# Patient Record
Sex: Male | Born: 1960 | Race: White | Hispanic: No | Smoking: Current every day smoker
Health system: Southern US, Community
[De-identification: ages and names within clinical notes are randomized; demographics above are authoritative.]

## PROBLEM LIST (undated history)

## (undated) DIAGNOSIS — G4733 Obstructive sleep apnea (adult) (pediatric): Secondary | ICD-10-CM

## (undated) DIAGNOSIS — I452 Bifascicular block: Secondary | ICD-10-CM

## (undated) DIAGNOSIS — I4892 Unspecified atrial flutter: Secondary | ICD-10-CM

## (undated) HISTORY — DX: Bifascicular block: I45.2

## (undated) HISTORY — PX: ATRIAL ABLATION SURGERY: SHX560

## (undated) HISTORY — DX: Unspecified atrial flutter: I48.92

## (undated) HISTORY — PX: NASAL SINUS SURGERY: SHX719

## (undated) HISTORY — DX: Obstructive sleep apnea (adult) (pediatric): G47.33

## (undated) HISTORY — PX: TONSILLECTOMY: SHX5217

---

## 2003-09-25 ENCOUNTER — Ambulatory Visit (HOSPITAL_COMMUNITY): Admission: RE | Admit: 2003-09-25 | Discharge: 2003-09-25 | Payer: Self-pay | Admitting: Pulmonary Disease

## 2005-02-05 ENCOUNTER — Ambulatory Visit: Admission: RE | Admit: 2005-02-05 | Discharge: 2005-02-05 | Payer: Self-pay | Admitting: *Deleted

## 2005-02-14 ENCOUNTER — Ambulatory Visit: Payer: Self-pay | Admitting: Pulmonary Disease

## 2005-09-05 ENCOUNTER — Ambulatory Visit (HOSPITAL_COMMUNITY): Admission: RE | Admit: 2005-09-05 | Discharge: 2005-09-05 | Payer: Self-pay | Admitting: Pulmonary Disease

## 2009-07-11 ENCOUNTER — Encounter: Payer: Self-pay | Admitting: Cardiology

## 2009-07-12 ENCOUNTER — Ambulatory Visit: Payer: Self-pay | Admitting: Cardiology

## 2009-07-12 ENCOUNTER — Encounter: Payer: Self-pay | Admitting: Cardiology

## 2009-07-13 ENCOUNTER — Encounter: Payer: Self-pay | Admitting: Cardiology

## 2009-07-13 ENCOUNTER — Inpatient Hospital Stay (HOSPITAL_COMMUNITY): Admission: EM | Admit: 2009-07-13 | Discharge: 2009-07-17 | Payer: Self-pay | Admitting: Cardiology

## 2009-07-13 ENCOUNTER — Ambulatory Visit: Payer: Self-pay | Admitting: Cardiology

## 2009-07-14 ENCOUNTER — Encounter: Payer: Self-pay | Admitting: Internal Medicine

## 2009-07-15 ENCOUNTER — Encounter: Payer: Self-pay | Admitting: Cardiology

## 2009-07-15 ENCOUNTER — Encounter: Payer: Self-pay | Admitting: Cardiovascular Disease

## 2009-07-16 ENCOUNTER — Encounter: Payer: Self-pay | Admitting: Cardiology

## 2009-07-20 ENCOUNTER — Ambulatory Visit: Payer: Self-pay | Admitting: Cardiology

## 2009-07-20 LAB — CONVERTED CEMR LAB: POC INR: 1.9

## 2009-07-23 ENCOUNTER — Ambulatory Visit: Payer: Self-pay | Admitting: Cardiology

## 2009-07-30 ENCOUNTER — Ambulatory Visit: Payer: Self-pay | Admitting: Cardiology

## 2009-07-30 LAB — CONVERTED CEMR LAB: POC INR: 2.1

## 2009-08-06 ENCOUNTER — Ambulatory Visit: Payer: Self-pay | Admitting: Cardiology

## 2009-08-11 DIAGNOSIS — Z9119 Patient's noncompliance with other medical treatment and regimen: Secondary | ICD-10-CM

## 2009-08-11 DIAGNOSIS — Z91199 Patient's noncompliance with other medical treatment and regimen due to unspecified reason: Secondary | ICD-10-CM | POA: Insufficient documentation

## 2009-08-11 DIAGNOSIS — G4733 Obstructive sleep apnea (adult) (pediatric): Secondary | ICD-10-CM

## 2009-08-11 DIAGNOSIS — I4891 Unspecified atrial fibrillation: Secondary | ICD-10-CM

## 2009-08-13 ENCOUNTER — Ambulatory Visit: Payer: Self-pay | Admitting: Cardiology

## 2009-08-13 DIAGNOSIS — I4892 Unspecified atrial flutter: Secondary | ICD-10-CM

## 2009-08-13 DIAGNOSIS — J45909 Unspecified asthma, uncomplicated: Secondary | ICD-10-CM | POA: Insufficient documentation

## 2009-09-02 ENCOUNTER — Encounter: Payer: Self-pay | Admitting: Cardiology

## 2010-12-22 LAB — CBC
HCT: 44.1 % (ref 39.0–52.0)
Hemoglobin: 15 g/dL (ref 13.0–17.0)
Hemoglobin: 15.3 g/dL (ref 13.0–17.0)
MCHC: 33.9 g/dL (ref 30.0–36.0)
MCHC: 34.1 g/dL (ref 30.0–36.0)
Platelets: 124 10*3/uL — ABNORMAL LOW (ref 150–400)
RBC: 4.68 MIL/uL (ref 4.22–5.81)
RBC: 4.85 MIL/uL (ref 4.22–5.81)
RDW: 13.7 % (ref 11.5–15.5)
RDW: 13.9 % (ref 11.5–15.5)
WBC: 6.3 10*3/uL (ref 4.0–10.5)

## 2010-12-22 LAB — BASIC METABOLIC PANEL
BUN: 7 mg/dL (ref 6–23)
CO2: 25 mEq/L (ref 19–32)
Calcium: 8.5 mg/dL (ref 8.4–10.5)
Glucose, Bld: 115 mg/dL — ABNORMAL HIGH (ref 70–99)
Sodium: 135 mEq/L (ref 135–145)

## 2010-12-22 LAB — PROTIME-INR
INR: 1.18 (ref 0.00–1.49)
INR: 1.81 — ABNORMAL HIGH (ref 0.00–1.49)
Prothrombin Time: 14.9 seconds (ref 11.6–15.2)
Prothrombin Time: 20.8 seconds — ABNORMAL HIGH (ref 11.6–15.2)
Prothrombin Time: 23.6 seconds — ABNORMAL HIGH (ref 11.6–15.2)

## 2010-12-22 LAB — HEPARIN LEVEL (UNFRACTIONATED)
Heparin Unfractionated: 0.62 IU/mL (ref 0.30–0.70)
Heparin Unfractionated: 0.66 IU/mL (ref 0.30–0.70)

## 2010-12-22 LAB — LIPID PANEL
Cholesterol: 168 mg/dL (ref 0–200)
Total CHOL/HDL Ratio: 5.4 RATIO
VLDL: 24 mg/dL (ref 0–40)

## 2011-02-03 NOTE — Procedures (Signed)
NAME:  ANDERS, HOHMANN               ACCOUNT NO.:  [id number]   MEDICAL RECORD NO.:  [id number]          PATIENT TYPE:  OUT   LOCATION:  RAD                           FACILITY:  [facility]   PHYSICIAN:  Edward L. Juanetta Gosling, M.D.DATE OF BIRTH:  Aug 14, 1961   DATE OF PROCEDURE:  09/05/2005  DATE OF DISCHARGE:                              PULMONARY FUNCTION TEST   RESULTS:  1.  Spirometry shows a very mild ventilatory defect with some evidence of      airflow obstruction in the level of the smaller airways.  2.  Lung volumes are normal.  3.  Diffusion capacity of carbon monoxide (DLCO) is normal.  4.  Arterial blood gases show mild relative resting hypoxemia.  5.  There is significant bronchodilator improvement.      Edward L. Juanetta Gosling, M.D.  Electronically Signed     ELH/MEDQ  D:  09/07/2005  T:  09/08/2005  Job:  [id number]

## 2011-02-03 NOTE — Procedures (Signed)
NAME:  Elijah Beard, Elijah Beard                         ACCOUNT NO.:  [id number]   MEDICAL RECORD NO.:  [id number]                   PATIENT TYPE:  OUT   LOCATION:  RESP                                 FACILITY:  [facility]   PHYSICIAN:  Edward L. Juanetta Gosling, M.D.             DATE OF BIRTH:  1961/07/31   DATE OF PROCEDURE:  DATE OF DISCHARGE:  09/25/2003                              PULMONARY FUNCTION TEST   Spirometry is normal.      ___________________________________________                                            Oneal Deputy. Juanetta Gosling, M.D.   ELH/MEDQ  D:  09/28/2003  T:  09/29/2003  Job:  [id number]

## 2011-02-03 NOTE — Procedures (Signed)
NAME:  Elijah Beard, Elijah Beard NO.:  [id number]   MEDICAL RECORD NO.:  [id number]          PATIENT TYPE:  OUT   LOCATION:  SLEEP LAB                     FACILITY:  [facility]   PHYSICIAN:  Marcelyn Bruins, M.D. [facility] DATE OF BIRTH:  23-May-1961   DATE OF STUDY:  02/05/2005                              NOCTURNAL POLYSOMNOGRAM   REFERRING PHYSICIAN:  Dr. Kem Boroughs.   INDICATION FOR THE STUDY:  Hypersomnia with sleep apnea. Epworth score: 13.   SLEEP ARCHITECTURE:  The patient had total sleep time of 305 minutes with  significantly reduced REM and slow wave sleep. Sleep onset was very  prolonged at 100 minutes and REM was fairly normal at 77 minutes.   IMPRESSION:  1. Mild obstructive sleep apnea/hypopnea syndrome with a respiratory      disturbance index of 14 events per hour and oxygen desaturation as low      as 88%. Events were not positional. The patient did not meet split      night criteria secondary to the small numbers of events, and most of      these occurring in the latter half of the study. Treatment for this      degree of sleep apnea may include weight loss, upper airway surgery,      oral appliance, and possibly C-PAP.  2. Snoring noted throughout the study but was not quantified by the sleep      technician.  3. Moderate numbers of leg jerks with significant sleep disruption.      Clinical correlation is suggested after appropriate treatment of sleep      apnea.  4. No clinically significant cardiac arrhythmias.      KC/MEDQ  D:  02/14/2005 12:59:13  T:  02/14/2005 13:21:57  Job:  [id number]

## 2011-03-02 DIAGNOSIS — I4891 Unspecified atrial fibrillation: Secondary | ICD-10-CM

## 2011-03-02 LAB — PULMONARY FUNCTION TEST

## 2011-03-07 ENCOUNTER — Ambulatory Visit (INDEPENDENT_AMBULATORY_CARE_PROVIDER_SITE_OTHER): Payer: Self-pay | Admitting: *Deleted

## 2011-03-07 DIAGNOSIS — I4891 Unspecified atrial fibrillation: Secondary | ICD-10-CM

## 2011-03-07 DIAGNOSIS — Z7901 Long term (current) use of anticoagulants: Secondary | ICD-10-CM | POA: Insufficient documentation

## 2011-03-14 ENCOUNTER — Ambulatory Visit (INDEPENDENT_AMBULATORY_CARE_PROVIDER_SITE_OTHER): Payer: Self-pay | Admitting: *Deleted

## 2011-03-14 DIAGNOSIS — I4891 Unspecified atrial fibrillation: Secondary | ICD-10-CM

## 2011-03-14 DIAGNOSIS — Z7901 Long term (current) use of anticoagulants: Secondary | ICD-10-CM

## 2011-03-23 ENCOUNTER — Ambulatory Visit (INDEPENDENT_AMBULATORY_CARE_PROVIDER_SITE_OTHER): Payer: Self-pay | Admitting: *Deleted

## 2011-03-23 ENCOUNTER — Encounter: Payer: Self-pay | Admitting: *Deleted

## 2011-03-23 ENCOUNTER — Ambulatory Visit (INDEPENDENT_AMBULATORY_CARE_PROVIDER_SITE_OTHER): Payer: Self-pay | Admitting: Cardiovascular Disease

## 2011-03-23 ENCOUNTER — Encounter: Payer: Self-pay | Admitting: Cardiovascular Disease

## 2011-03-23 DIAGNOSIS — Z7901 Long term (current) use of anticoagulants: Secondary | ICD-10-CM

## 2011-03-23 DIAGNOSIS — I4891 Unspecified atrial fibrillation: Secondary | ICD-10-CM

## 2011-03-23 DIAGNOSIS — R0602 Shortness of breath: Secondary | ICD-10-CM

## 2011-03-23 DIAGNOSIS — G4733 Obstructive sleep apnea (adult) (pediatric): Secondary | ICD-10-CM

## 2011-03-23 DIAGNOSIS — I4892 Unspecified atrial flutter: Secondary | ICD-10-CM

## 2011-03-23 DIAGNOSIS — E669 Obesity, unspecified: Secondary | ICD-10-CM

## 2011-03-23 NOTE — Progress Notes (Signed)
50 yo morbidly obese patient of Dr Jens Som.  Most recently seen at [facility] by Dr Diona Browner   S/P aflutter ablation 06/2009.    History of asthma confirmed by spirometry 5/12.  Sleep study 5/12 with 14 apnic episodes/hour and some desats to 88%.  Hospitalized ans found to be in recurrent aflutter.  Started on coumadin in hospital.  INR last week in [place] 3.0 and INR today 3.4.  Still with some SOB but no palpitatoins or SSCP.  Reviewed records from [facility].  Echo with normal EF and TSH normal.  Patient noncompliant with CPAP.  Relationship between sleep apnea and atrial arrhythmias discussed.    Long discussion with patient about his weight.  No longer working in [employer] due to weight. Got layed off.  Referred to [facility] bariatric surgery and gave him Dr Lily Peer contact info at [facility].    Should be ready for [facility] in 2 weeks.  If not succesful would refer back to EP as he appears to have residual atypical flutter on ECG  ROS: Denies fever, malais, weight loss, blurry vision, decreased visual acuity, cough, sputum, SOB, hemoptysis, pleuritic pain, palpitaitons, heartburn, abdominal pain, melena, lower extremity edema, claudication, or rash.  All other systems reviewed and negative  General: Affect appropriate Obese and chronically out of shape HEENT: normal Neck supple with no adenopathy JVP normal no bruits no thyromegaly Lungs clear with no wheezing and good diaphragmatic motion Heart:  S1/S2 no murmur,rub, gallop or click PMI normal Abdomen: benighn, BS positve, no tenderness, no AAA no bruit.  No HSM or HJR Distal pulses intact with no bruits No edema Neuro non-focal Skin warm and dry No muscular weakness   Current Outpatient Prescriptions  Medication Sig Dispense Refill  . cetirizine (ZYRTEC) 5 MG tablet Take 5 mg by mouth daily.        Marland Kitchen diltiazem (CARDIZEM) 60 MG tablet Take 60 mg by mouth 4 (four) times daily.        . fluticasone (FLOVENT HFA) 110 MCG/ACT inhaler  Inhale 1 puff into the lungs 2 (two) times daily.        Marland Kitchen warfarin (COUMADIN) 5 MG tablet Take by mouth as directed.          Allergies  Review of patient's allergies indicates not on file.  Electrocardiogram: ATrial flutter 97 RBBB old flutter atypical  Assessment and Plan

## 2011-03-23 NOTE — Patient Instructions (Signed)
Your physician recommends that you schedule a follow-up appointment in: AFTER PROCEDURE Your physician has requested that you have a TEE. During a TEE, sound waves are used to create images of your heart. It provides your doctor with information about the size and shape of your heart and how well your heart's chambers and valves are working. In this test, a transducer is attached to the end of a flexible tube that's guided down your throat and into your esophagus (the tube leading from you mouth to your stomach) to get a more detailed image of your heart. You are not awake for the procedure. Please see the instruction sheet given to you today. For further information please visit [email or web address]   [facility]  [phone] DR. FERNANDEZContact Information  Appointment:  [phone]  Office:  [phone]  Fax:  [phone]  Email: [email or web address] 

## 2011-03-23 NOTE — Assessment & Plan Note (Signed)
Continue coumadin and cardizem.  Mild swelling from cardizem but patient doesn't want to switch.  DCC in 2 weeks.  If not succesful refer back to EPS

## 2011-03-23 NOTE — Assessment & Plan Note (Signed)
Encouraged him to wear CPAP.  Will not improve until weight comes down

## 2011-03-23 NOTE — Assessment & Plan Note (Signed)
INR Rx last two weeks.  Continue to check weekly until [facility].  Mild bleeding in gums but nothing else

## 2011-03-23 NOTE — Progress Notes (Signed)
Addended by: Augusto Gamble on: 03/23/2011 01:13 PM   Modules accepted: Orders

## 2011-03-23 NOTE — Assessment & Plan Note (Signed)
Most significant health issue.  Discussed low carb diet.  Referred to bariatric surgery programs

## 2011-03-30 ENCOUNTER — Ambulatory Visit (INDEPENDENT_AMBULATORY_CARE_PROVIDER_SITE_OTHER): Payer: Self-pay | Admitting: *Deleted

## 2011-03-30 DIAGNOSIS — I4891 Unspecified atrial fibrillation: Secondary | ICD-10-CM

## 2011-03-30 DIAGNOSIS — Z7901 Long term (current) use of anticoagulants: Secondary | ICD-10-CM

## 2011-03-30 LAB — POCT INR: INR: 3

## 2011-03-30 MED ORDER — DILTIAZEM HCL 60 MG PO TABS: 60.0000 mg | ORAL_TABLET | Freq: Four times a day (QID) | ORAL | Status: DC

## 2011-03-30 MED ORDER — WARFARIN SODIUM 5 MG PO TABS: 5.0000 mg | ORAL_TABLET | ORAL | Status: DC

## 2011-04-07 ENCOUNTER — Telehealth: Payer: Self-pay | Admitting: *Deleted

## 2011-04-07 ENCOUNTER — Ambulatory Visit (INDEPENDENT_AMBULATORY_CARE_PROVIDER_SITE_OTHER): Payer: Self-pay | Admitting: *Deleted

## 2011-04-07 DIAGNOSIS — Z7901 Long term (current) use of anticoagulants: Secondary | ICD-10-CM

## 2011-04-07 DIAGNOSIS — I4892 Unspecified atrial flutter: Secondary | ICD-10-CM

## 2011-04-07 DIAGNOSIS — I4891 Unspecified atrial fibrillation: Secondary | ICD-10-CM

## 2011-04-07 LAB — POCT INR: INR: 2.4

## 2011-04-07 NOTE — Telephone Encounter (Signed)
Left message to notify pt DCCV scheduled for Monday, April 10, 2011. He should arrive at [facility] at 8am and be npo after midnight. Also notified pt to have a driver.

## 2011-04-07 NOTE — Telephone Encounter (Signed)
Left message on home voicemail asking for return call ASAP regarding cardioversion for Monday AM. Pt was told by Misty Stanley in coumadin clinic to expect this call.

## 2011-04-10 ENCOUNTER — Encounter: Payer: Self-pay | Admitting: Internal Medicine

## 2011-04-10 DIAGNOSIS — I4891 Unspecified atrial fibrillation: Secondary | ICD-10-CM

## 2011-04-10 LAB — PROTIME-INR

## 2011-04-12 ENCOUNTER — Encounter: Payer: Self-pay | Admitting: Cardiology

## 2011-04-14 ENCOUNTER — Encounter: Payer: Self-pay | Admitting: *Deleted

## 2011-04-17 ENCOUNTER — Encounter: Payer: Self-pay | Admitting: Cardiology

## 2011-04-17 ENCOUNTER — Ambulatory Visit (INDEPENDENT_AMBULATORY_CARE_PROVIDER_SITE_OTHER): Payer: Self-pay | Admitting: *Deleted

## 2011-04-17 ENCOUNTER — Ambulatory Visit (INDEPENDENT_AMBULATORY_CARE_PROVIDER_SITE_OTHER): Payer: Self-pay | Admitting: Cardiology

## 2011-04-17 VITALS — BP 123/76 | HR 76 | Ht 71.0 in | Wt 351.0 lb

## 2011-04-17 DIAGNOSIS — Z7901 Long term (current) use of anticoagulants: Secondary | ICD-10-CM

## 2011-04-17 DIAGNOSIS — I4891 Unspecified atrial fibrillation: Secondary | ICD-10-CM

## 2011-04-17 DIAGNOSIS — I4892 Unspecified atrial flutter: Secondary | ICD-10-CM

## 2011-04-17 DIAGNOSIS — G4733 Obstructive sleep apnea (adult) (pediatric): Secondary | ICD-10-CM

## 2011-04-17 LAB — POCT INR: INR: 2.7

## 2011-04-17 MED ORDER — DILTIAZEM HCL ER COATED BEADS 120 MG PO CP24: 120.0000 mg | ORAL_CAPSULE | Freq: Every day | ORAL | Status: DC

## 2011-04-17 NOTE — Progress Notes (Signed)
Clinical Summary Elijah Beard is a 50 y.o.male presenting for followup. He was seen by Dr. [place] Emms earlier in the month in the [facility] office, previously Dr. Jens Som. History is reviewed below including previous RFA of atrial flutter by Dr. Graciela Husbands. He was recently reinitiated on Coumadin, followed in our Coumadin clinic, and underwent successful DCCV last week.  Generally, he reports doing well, no significant palpitations. He remains in sinus rhythm by ECG today. We reviewed his history, also discussed compliance with CPAP therapy as it relates to better control of atrial arrhythmias. He is also using a steroid inhaler for his asthma, with significantly less need for rescue inhalers. Plans to refill this with Dr. Juanetta Gosling.  No Known Allergies  Medication list reviewed.  Past Medical History  Diagnosis Date  . Atrial flutter     Status post RFA 10/10 - Dr. Graciela Husbands  . Asthma   . Obstructive sleep apnea     Past Surgical History  Procedure Date  . Tonsillectomy   . Nasal sinus surgery     History reviewed. No pertinent family history.  Social History Elijah Beard reports that he quit smoking about 5 years ago. His smoking use included Cigarettes. He has a 20 pack-year smoking history. He has never used smokeless tobacco. [name] reports that he drinks alcohol.  Review of Systems No reported bleeding problems. Some vague abdominal discomfort occasionally. Otherwise negative.  Physical Examination Filed Vitals:   04/17/11 1516  BP: 123/76  Pulse: 76  Morbidly obese male in no acute distress. HEENT: Conjunctiva and lids normal, oropharynx with moist mucosa. Neck: Increased girth, supple without obvious bruits or thyromegaly. Lungs: Clear but diminished breath sounds, non-labored. Cardiac: Regular rate and rhythm, no S3. Abdomen: Obese, nontender, bowel sounds present. Skin: Warm and dry, some distal stasis. Musculoskeletal: No kyphosis. Neuropsychiatric: Alert and oriented  x3, affect appropriate.      Problem List and Plan

## 2011-04-17 NOTE — Patient Instructions (Signed)
Your physician wants you to follow-up in: 6 months. You will receive a reminder letter in the mail one-two months in advance. If you don't receive a letter, please call our office to schedule the follow-up appointment. Finish current supply of diltiazem (Cardizem). Then start Cardizem (diltiazem) 120 mg daily. Continue current dose of Coumadin and then stop coumadin in 2 weeks.

## 2011-04-17 NOTE — Assessment & Plan Note (Signed)
Remains in sinus rhythm. Plan to continue Coumadin for the next few weeks and then discontinue with low CHADS2 score. He will stay on Cardizem which will be changed to Cardizem CD 120 mg daily. Reinforced compliance with CPAP, also suggested weight loss and exercise. If he has recurrent atrial arrhythmias, he can be referred back to EP for discussion of possible antiarrhythmic therapy or repeat RFA.

## 2011-04-17 NOTE — Assessment & Plan Note (Signed)
Reinforce compliance with CPAP.

## 2011-04-27 ENCOUNTER — Telehealth: Payer: Self-pay | Admitting: Cardiology

## 2011-04-27 NOTE — Telephone Encounter (Signed)
Has questions about a cruise they are going on needs answers by 04/28/11. Call Melissa at [phone] after 3:00. Dr. Diona Browner patient.

## 2011-04-27 NOTE — Telephone Encounter (Signed)
Remain compliant with the medications, and enjoy the cruise!

## 2011-04-27 NOTE — Telephone Encounter (Signed)
Patient has a planned cruise for October 2012 and wants to know what he can do while out at sea, if his heart starts racing or he goes in atrial fibrillation.

## 2011-04-27 NOTE — Telephone Encounter (Signed)
Patient informed of the below information. 

## 2011-05-05 ENCOUNTER — Ambulatory Visit (INDEPENDENT_AMBULATORY_CARE_PROVIDER_SITE_OTHER): Payer: Self-pay | Admitting: *Deleted

## 2011-05-05 VITALS — BP 108/75 | HR 70

## 2011-05-05 DIAGNOSIS — I4891 Unspecified atrial fibrillation: Secondary | ICD-10-CM

## 2011-05-05 NOTE — Progress Notes (Signed)
Pt notified and verbalized understanding.  He states he has checked HR and it was as high as 130's. He will monitor over the weekend and notify if HR remains elevated.

## 2011-05-05 NOTE — Progress Notes (Signed)
EKG reviewed. Sinus tach, no AFib. Continue current medication regimen.

## 2011-05-05 NOTE — Progress Notes (Signed)
Pt states his heart went out of rhythm last night. He denies chest pain or any other symptoms. He states he can just tell it is out of rhythm.   EKG done. This will be scanned in for Gene's review in Dr. Ival Bible absence.

## 2011-05-08 ENCOUNTER — Ambulatory Visit (INDEPENDENT_AMBULATORY_CARE_PROVIDER_SITE_OTHER): Payer: Self-pay | Admitting: *Deleted

## 2011-05-08 DIAGNOSIS — R002 Palpitations: Secondary | ICD-10-CM

## 2011-05-08 DIAGNOSIS — I4892 Unspecified atrial flutter: Secondary | ICD-10-CM

## 2011-05-08 MED ORDER — WARFARIN SODIUM 5 MG PO TABS: ORAL_TABLET | ORAL | Status: DC

## 2011-05-08 MED ORDER — DILTIAZEM HCL 60 MG PO TABS: 60.0000 mg | ORAL_TABLET | Freq: Four times a day (QID) | ORAL | Status: DC

## 2011-05-08 NOTE — Progress Notes (Signed)
If sense of palpitations continues, consider a 24-hour Holter monitor. Could also increase Cardizem 60 mg from TID back to QID if blood pressure allows. This can ultimately be changed to a long-acting formulation.

## 2011-05-08 NOTE — Progress Notes (Signed)
Pt walked into the office stating HR is still fast. His rate has been 130's-140's throughout the weekend. Pt states his heart is "acting up" now. EKG shows A.Flutter w/rate 91-112. Pt states HR is okay when he's laying day and resting but runs 130's-140's when active.   Discussed w/Dr. Diona Browner. Diltiazem will be increased back to 60 mg four times per day. Pt states he had been given RX to change to 120 mg daily, but he would prefer to continue 60 mg d/t lower cost. Pt will resume Warfarin 5 mg daily. He will have INR check on Friday, August 24th with Misty Stanley. Also 24 hour holter placed today to further evaluate rhythm and rate.

## 2011-05-08 NOTE — Patient Instructions (Signed)
   Increase Diltiazem to 60 mg four times per day.  Resume Coumadin 5 mg daily. (check on 8/24)  EP evaluation-[facility] office will call with appointment information  24 hour holter placed today

## 2011-05-09 NOTE — Progress Notes (Signed)
Noted. With recurrent atrial flutter status post recent cardioversion in July, expect that he will need referral back to EP to discuss other treatment options. He had ablation with Dr. Graciela Husbands in the past. Coumadin is being resumed with continued rate control as well. Also would ensure compliance with CPAP for obstructive sleep apnea.

## 2011-05-09 NOTE — Progress Notes (Signed)
Spoke with pt's wife who states pt is compliant with CPAP. Pt is scheduled to see Dr. Graciela Husbands on August 31st. Pt's wife is aware.

## 2011-05-12 ENCOUNTER — Ambulatory Visit (INDEPENDENT_AMBULATORY_CARE_PROVIDER_SITE_OTHER): Payer: Self-pay | Admitting: *Deleted

## 2011-05-12 DIAGNOSIS — Z7901 Long term (current) use of anticoagulants: Secondary | ICD-10-CM

## 2011-05-12 DIAGNOSIS — I4891 Unspecified atrial fibrillation: Secondary | ICD-10-CM

## 2011-05-12 LAB — POCT INR: INR: 1.4

## 2011-05-19 ENCOUNTER — Ambulatory Visit (INDEPENDENT_AMBULATORY_CARE_PROVIDER_SITE_OTHER): Payer: Self-pay | Admitting: *Deleted

## 2011-05-19 ENCOUNTER — Encounter: Payer: Self-pay | Admitting: Internal Medicine

## 2011-05-19 ENCOUNTER — Ambulatory Visit (INDEPENDENT_AMBULATORY_CARE_PROVIDER_SITE_OTHER): Payer: Self-pay | Admitting: Internal Medicine

## 2011-05-19 DIAGNOSIS — I4891 Unspecified atrial fibrillation: Secondary | ICD-10-CM

## 2011-05-19 DIAGNOSIS — Z7901 Long term (current) use of anticoagulants: Secondary | ICD-10-CM

## 2011-05-19 DIAGNOSIS — I4892 Unspecified atrial flutter: Secondary | ICD-10-CM

## 2011-05-19 LAB — POCT INR: INR: 2.3

## 2011-05-19 NOTE — Assessment & Plan Note (Signed)
As above.

## 2011-05-19 NOTE — Patient Instructions (Signed)
Dr. Odessa Fleming nurse, Herbert Seta, or Dr. Jenel Lucks nurse, Tresa Endo, will be in touch with you at the beginning of next week about scheduling your ablation. We will be looking at 05/30/11 if this will work with the anesthesia department.

## 2011-05-19 NOTE — Progress Notes (Signed)
HPI: Elijah Beard is a 50 y.o. male Who underwent flutter ablation in 2010. He had recurrent symptoms and was found to be in atrial flutter again this summer and underwent cardioversion following anticoagulation. He reverted again to atrial flutter. This is manifested by significant exercise intolerance as well as tachypalpitations  Evaluation at that time included a Holter monitor demonstrated a mean heart rate of 100 with rates ranging from 4240 2D ECHO cardiogram demonstrated normal left ventricular function  He  has a history of right bundle branch block;  He is also morbidly obese    Current Outpatient Prescriptions  Medication Sig Dispense Refill  . cetirizine (ZYRTEC) 5 MG tablet Take 5 mg by mouth as needed.       . diltiazem (CARDIZEM) 60 MG tablet Take 1 tablet (60 mg total) by mouth 4 (four) times daily.  120 tablet  6  . fluticasone (FLOVENT HFA) 110 MCG/ACT inhaler Inhale 1 puff into the lungs 2 (two) times daily.        Marland Kitchen warfarin (COUMADIN) 5 MG tablet Take 1-2 tablets daily by mouth as directed by the Anticoagulation Clinic  45 tablet  2    No Known Allergies  Past Medical History  Diagnosis Date  . Atrial flutter     Status post RFA 10/10 - Dr. Graciela Husbands  . Asthma   . Obstructive sleep apnea     Past Surgical History  Procedure Date  . Tonsillectomy   . Nasal sinus surgery     Family History  Problem Relation Age of Onset  . Cancer Father     History   Social History  . Marital Status: Single    Spouse Name: N/A    Number of Children: N/A  . Years of Education: N/A   Occupational History  . Not on file.   Social History Main Topics  . Smoking status: Former Smoker -- 1.0 packs/day for 20 years    Types: Cigarettes    Quit date: 11/17/2003  . Smokeless tobacco: Never Used  . Alcohol Use: Yes     On occasion   . Drug Use: No  . Sexually Active: Not on file   Other Topics Concern  . Not on file   Social History Narrative  . No narrative on  file    Fourteen point review of systems was negative except as noted in HPI and PMH   PHYSICAL EXAMINATION  Blood pressure 149/94, pulse 54, height 6' (1.829 m), weight 346 lb (156.945 kg).   Well developed and nourished morbidly obese gentleman in no acute distress HENT normal Neck supple with JVP 8-9 cm Carotids brisk and full without bruits Back without scoliosis or kyphosis Clear Irregular rate rate and rhythm, no murmurs or gallops Abd-soft with active BS without hepatomegaly or midline pulsation Femoral pulses 2+ distal pulses intact No Clubbing cyanosis trace edemaSkin-warm and dry LN-neg submandibular and supraclavicular A & Oriented CN 3-12 normal  Grossly normal sensory and motor function Affect engaging . Holter monitor demonstrated typical atrial flutter with variable conduction

## 2011-05-19 NOTE — Assessment & Plan Note (Signed)
The patient has an atrial flutter following prior ablation. 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 He and his wife are very very anxious about this I would like to be done before I return from vacation at the end of September. Hence I have taken the liberty of writing him in to be done by Dr. Johney Frame Given his morbid obesity he would need to be done under general anesthesia.  We have reviewed the risks benefits and alternatives.

## 2011-05-24 ENCOUNTER — Telehealth: Payer: Self-pay | Admitting: Internal Medicine

## 2011-05-24 ENCOUNTER — Encounter: Payer: Self-pay | Admitting: *Deleted

## 2011-05-24 NOTE — Telephone Encounter (Signed)
Pt's wife calling re ablation on 9-11, has it been scheduled?

## 2011-05-24 NOTE — Telephone Encounter (Signed)
I spoke with the patient's wife and have given her the date and time for the patient's procedure. The patient will go to the [facility] office on 9/12 for an EKG and the [facility] for labs the same day. I have mailed the patient's instructions to him with a lab order. I have spoken with Judeth Cornfield in [place] and she has scheduled the patient for an EKG on 9/12 at 9:00am.

## 2011-05-26 ENCOUNTER — Encounter: Payer: Self-pay | Admitting: *Deleted

## 2011-05-31 ENCOUNTER — Ambulatory Visit (INDEPENDENT_AMBULATORY_CARE_PROVIDER_SITE_OTHER): Payer: Self-pay | Admitting: *Deleted

## 2011-05-31 ENCOUNTER — Encounter: Payer: Self-pay | Admitting: Internal Medicine

## 2011-05-31 DIAGNOSIS — I4891 Unspecified atrial fibrillation: Secondary | ICD-10-CM

## 2011-05-31 NOTE — Progress Notes (Signed)
ekg done for ablation on Friday

## 2011-06-02 ENCOUNTER — Ambulatory Visit (HOSPITAL_COMMUNITY)
Admission: RE | Admit: 2011-06-02 | Discharge: 2011-06-03 | Disposition: A | Discharge status: Home / Self Care | Payer: Self-pay | Source: Ambulatory Visit | Attending: Internal Medicine | Admitting: Internal Medicine

## 2011-06-02 DIAGNOSIS — G4733 Obstructive sleep apnea (adult) (pediatric): Secondary | ICD-10-CM | POA: Insufficient documentation

## 2011-06-02 DIAGNOSIS — I4892 Unspecified atrial flutter: Secondary | ICD-10-CM | POA: Insufficient documentation

## 2011-06-02 DIAGNOSIS — I4510 Unspecified right bundle-branch block: Secondary | ICD-10-CM | POA: Insufficient documentation

## 2011-06-02 DIAGNOSIS — J45909 Unspecified asthma, uncomplicated: Secondary | ICD-10-CM | POA: Insufficient documentation

## 2011-06-02 LAB — PROTIME-INR: INR: 2.76 — ABNORMAL HIGH (ref 0.00–1.49)

## 2011-06-03 LAB — PROTIME-INR
INR: 2.69 — ABNORMAL HIGH (ref 0.00–1.49)
Prothrombin Time: 29 seconds — ABNORMAL HIGH (ref 11.6–15.2)

## 2011-06-06 ENCOUNTER — Encounter: Payer: Self-pay | Admitting: *Deleted

## 2011-06-06 ENCOUNTER — Telehealth: Payer: Self-pay | Admitting: Cardiology

## 2011-06-06 NOTE — Telephone Encounter (Signed)
Appt changed to 9/21 am per pt request

## 2011-06-09 ENCOUNTER — Encounter: Payer: Self-pay | Admitting: *Deleted

## 2011-06-09 ENCOUNTER — Ambulatory Visit (INDEPENDENT_AMBULATORY_CARE_PROVIDER_SITE_OTHER): Payer: Self-pay | Admitting: *Deleted

## 2011-06-09 DIAGNOSIS — I4891 Unspecified atrial fibrillation: Secondary | ICD-10-CM

## 2011-06-09 DIAGNOSIS — Z7901 Long term (current) use of anticoagulants: Secondary | ICD-10-CM

## 2011-06-16 ENCOUNTER — Ambulatory Visit (INDEPENDENT_AMBULATORY_CARE_PROVIDER_SITE_OTHER): Payer: Self-pay | Admitting: *Deleted

## 2011-06-16 DIAGNOSIS — R0989 Other specified symptoms and signs involving the circulatory and respiratory systems: Secondary | ICD-10-CM

## 2011-06-20 ENCOUNTER — Ambulatory Visit (INDEPENDENT_AMBULATORY_CARE_PROVIDER_SITE_OTHER): Payer: Self-pay | Admitting: *Deleted

## 2011-06-20 DIAGNOSIS — I4891 Unspecified atrial fibrillation: Secondary | ICD-10-CM

## 2011-06-20 DIAGNOSIS — Z7901 Long term (current) use of anticoagulants: Secondary | ICD-10-CM

## 2011-06-21 ENCOUNTER — Encounter: Payer: Self-pay | Admitting: Internal Medicine

## 2011-06-22 NOTE — Op Note (Signed)
NAME:  Elijah Beard, Elijah Beard NO.:  [id number]  MEDICAL RECORD NO.:  [id number]  LOCATION:                                 FACILITY:  PHYSICIAN:  Hillis Range, MD       DATE OF BIRTH:  Dec 18, 1960  DATE OF PROCEDURE: DATE OF DISCHARGE:                              OPERATIVE REPORT   SURGEON:  Hillis Range, MD  PREPROCEDURE DIAGNOSES: 1. Typical-appearing atrial flutter. 2. Status post prior cavotricuspid isthmus ablation by Dr. Graciela Husbands.  POSTPROCEDURE DIAGNOSIS:  Counterclockwise isthmus-dependent right atrial flutter.  PROCEDURES: 1. Comprehensive electrophysiology study. 2. Coronary sinus pacing and recording. 3. Mapping of supraventricular tachycardia . 4. Radiofrequency ablation of supraventricular tachycardia.  INTRODUCTION:  Elijah Beard is a pleasant 50 year old gentleman with a history of atrial flutter who presents today for EP study and radiofrequency ablation.  He previously presented with atrial flutter and underwent cavotricuspid isthmus ablation by Dr. Graciela Husbands in October 2010.  He did very well initially following ablation.  Unfortunately, he developed subsequent recurrent typical-appearing atrial flutter.  He therefore presents today for EP study and radiofrequency ablation.  DESCRIPTION OF PROCEDURE:  Informed written consent was obtained and the patient was brought to the Electrophysiology Lab in the fasting state. He was adequately sedated with general anesthesia as outlined in the anesthesia report.  The patient's right groin was prepped and draped in the usual sterile fashion by the EP Lab staff.  Using a percutaneous Seldinger technique, one 6, one 7, and one 8 [place] hemostasis sheaths were placed into the right common femoral vein.  A 7-French Biosense Webster decapolar coronary sinus catheter was introduced through the right common femoral vein and advanced into the coronary sinus for recording and pacing from this location.  A 6-French  quadripolar Josephson catheter was introduced through the right common femoral vein and advanced into the right ventricle for recording and pacing.  This catheter was then pulled back to the His bundle location.  A 7-French Biosense Webster duodecapolar Halo catheter was introduced through the right common femoral vein and advanced into the right atrium.  This catheter was positioned around the tricuspid valve annulus.  The patient presented to the Electrophysiology Lab in sinus rhythm.  He had a right bundle-branch QRS morphology at baseline.  His PR interval was 141 milliseconds with a QRS duration of 150 milliseconds and a QT interval of 390 milliseconds.  His AH interval measured 62 milliseconds with an HV interval of 56 milliseconds.  Ventricular pacing was performed which revealed midline concentric decremental VA conduction with a VA block and cycle length of 410 milliseconds with no arrhythmias observed. Atrial pacing was performed which revealed no evidence of PR greater than RR.  The AV Wenckebach cycle length was 410 milliseconds.Electrograms from the Halo catheter when pacing at a cycle length of 600 milliseconds ream revealed that the patient had had recurrent conduction through its cavotricuspid isthmus.  Rapid atrial pacing was then continued down to a cycle length of 200 milliseconds at which time the patient converted to atrial flutter.  The atrial flutter cycle length was 260 milliseconds.  2:1 AV conduction was observed.  The coronary sinus activation revealed proximal  to distal activation.  The surface EKG revealed a typical-appearing atrial flutter.  The Halo sequence revealed a counterclockwise rotation around the tricuspid valve annulus. Entrainment mapping was performed from the left atrium which revealed a long post-pacing interval when compared to the tachycardia cycle length. Entrainment was then performed within the cavotricuspid isthmus which revealed a  post-pacing interval equal to the tachycardia cycle length. The patient was therefore felt to have recurrent counterclockwise isthmus-dependent right atrial flutter.  I therefore elected to perform cavotricuspid isthmus ablation.  A 7-French [employer] II XP Prime 8-mm ablation catheter was introduced through the right common femoral vein and advanced into the right atrium.  Mapping of the cavotricuspid isthmus was performed which revealed relatively standard isthmus.  A series of two radiofrequency applications were delivered with a target temperature of 60 degrees at 50 watts each for 120 seconds.  Atrial flutter slowed and then terminated during the second radiofrequency application.  A series of four additional radiofrequency applications were delivered with a target temperature of 60 degrees at 50 watts for 120 seconds each.  Complete bidirectional cavotricuspid isthmus block was then observed with differential atrial pacing as evident by a shift in activation sequence from the Halo catheter.  A series of two additional radiofrequency applications were then delivered as bonus burns along the cavotricuspid isthmus.  Following ablation, the stimulus to earliest atrial activation recorded bidirectionally across the isthmus measured 160 milliseconds.  The patient was observed for 20 minutes without return of conduction through the cavotricuspid isthmus. The patient was observed for 30 minutes without return of conduction through the cavotricuspid isthmus.  Following ablation, the AH interval measured 78 milliseconds with an HV interval of 54 milliseconds.  Atrial pacing was performed which revealed an AV Wenckebach cycle length of 350 milliseconds.  Atrial pacing was continued down to a cycle length of 200 milliseconds with no arrhythmias observed.  Following ablation, the AH interval measured 78 milliseconds in the HV interval measured 54 milliseconds.  The procedure was  therefore considered completed.  All catheters were removed and the sheaths were aspirated and flushed.  The sheaths were removed and hemostasis was assured.  There were no early apparent complications.  CONCLUSIONS: 1. Sinus rhythm upon presentation. 2. Return of conduction through the cavotricuspid isthmus with     inducible counterclockwise isthmus-dependent right atrial flutter     today. 3. Successful ablation of atrial flutter along the usual cavotricuspid     isthmus with complete bidirectional cavotricuspid isthmus block     achieved. 4. No inducible arrhythmias following ablation. 5. No early apparent complications.     Hillis Range, MD     JA/MEDQ  D:  06/02/2011  T:  06/02/2011  Job:  [id number]  cc:   Duke Salvia, MD, [facility] Deputy. Juanetta Gosling, M.D.  Electronically Signed by Hillis Range MD on 06/22/2011 08:44:08 AM

## 2011-06-28 ENCOUNTER — Telehealth: Payer: Self-pay | Admitting: Internal Medicine

## 2011-06-28 NOTE — Telephone Encounter (Signed)
Patient aware and will stop as of Friday

## 2011-06-28 NOTE — Telephone Encounter (Signed)
Pt said dr Johney Frame ok'd him to go off coumadin after he see's him, however it was to be in one month which would be Friday, but his appt not till November, wants to know if he can go ahead and go off

## 2011-06-28 NOTE — Telephone Encounter (Signed)
Please stop coumadin 4 weeks post ablation.

## 2011-06-28 NOTE — Telephone Encounter (Signed)
Returned call to patient and let him know I will forward with Dr Johney Frame and see  It will be 4 weeks Friday s/p a flutter ablation  He was in NSR upon arrival and has not had any problems since ablation  INR last week 2.9 Call back on cell [phone]

## 2011-07-04 ENCOUNTER — Encounter: Payer: Self-pay | Admitting: *Deleted

## 2011-07-04 ENCOUNTER — Ambulatory Visit: Payer: Self-pay | Admitting: *Deleted

## 2011-07-07 ENCOUNTER — Encounter: Payer: Self-pay | Admitting: *Deleted

## 2011-07-07 NOTE — Discharge Summary (Signed)
  NAME:  Elijah Beard, Elijah Beard NO.:  [id number]  MEDICAL RECORD NO.:  [id number]  LOCATION:  3707                         FACILITY:  [facility]  PHYSICIAN:  Gerrit Friends. Dietrich Pates, MD, FACCDATE OF BIRTH:  11/30/1960  DATE OF ADMISSION:  06/02/2011 DATE OF DISCHARGE:  06/03/2011                              DISCHARGE SUMMARY   PROCEDURES: 1. Comprehensive electrophysiology study. 2. Coronary sinus pacing and recording. 3. Mapping of supraventricular tachycardia. 4. Radiofrequency ablation of supraventricular tachycardia.  PRIMARY FINAL DISCHARGE DIAGNOSES: 1. Atrial flutter. 2. Supraventricular tachycardia. 3. Right bundle-branch block. 4. Morbid obesity. 5. Asthma. 6. Obstructive sleep apnea. 7. Status post tonsillectomy and nasal surgery. 8. Anticoagulation with Coumadin, INR 2.69 at discharge.  TIME AT DISCHARGE:  32 minutes.  HOSPITAL COURSE:  [name] is a 50 year old male with a history ofatrial flutter that was ablated by Dr. Graciela Husbands in 2010.  He had recurrent symptoms and was found to be in atrial flutter.  He was symptomatic.  He was scheduled for an ablation and came to the hospital for further procedure on June 02, 2011.  He was in sinus rhythm on presentation.  He had inducible counterclockwise isthmus-dependent right atrial flutter.  This was mapped and ablated during the procedure.  He is to be on Coumadin for 4 weeks and stop Cardizem.  On June 03, 2011, he was seen by Dr. Dietrich Pates.  He was having no complications and considered stable for discharge, to follow up as an outpatient.  DISCHARGE INSTRUCTIONS: 1. His activity level is to be increased gradually. 2. He is to get a Coumadin check as scheduled. 3. He is to follow up with Dr. Johney Frame in 4 weeks and we will call him     with an appointment. 4. He is to follow up with Dr. Graciela Husbands and Dr. Juanetta Gosling as needed or as     scheduled.  DISCHARGE MEDICATIONS: 1. Flovent b.i.d. 2. Coumadin 5 mg  daily or as directed. 3. Diltiazem is discontinued.     Theodore Demark, PA-C   ______________________________ Gerrit Friends. Dietrich Pates, MD, [facility]    RB/MEDQ  D:  06/03/2011  T:  06/03/2011  Job:  [id number]  cc:   Dr. Juanetta Gosling  Electronically Signed by Theodore Demark PA-C on 06/26/2011 06:38:08 AM Electronically Signed by [place] Bing MD [facility] on 07/07/2011 03:00:37 PM

## 2011-07-24 ENCOUNTER — Encounter: Payer: Self-pay | Admitting: Internal Medicine

## 2011-07-24 ENCOUNTER — Ambulatory Visit (INDEPENDENT_AMBULATORY_CARE_PROVIDER_SITE_OTHER): Payer: Self-pay | Admitting: Internal Medicine

## 2011-07-24 VITALS — BP 120/82 | HR 74 | Ht 72.0 in | Wt 252.0 lb

## 2011-07-24 DIAGNOSIS — I4892 Unspecified atrial flutter: Secondary | ICD-10-CM

## 2011-07-24 NOTE — Assessment & Plan Note (Signed)
Doing well s/p ablation He has already stopped coumadin  No further workup planned  Return as needed  Compliance with weight loss and CPAP were encouraged

## 2011-07-24 NOTE — Patient Instructions (Signed)
Your physician recommends that you schedule a follow-up appointment in: as needed  

## 2011-07-24 NOTE — Progress Notes (Signed)
The patient presents today for routine electrophysiology followup.  Since his recent atrial flutter ablation, the patient reports doing very well.  He denies procedure related complications.  He has had no further symptoms of arrhythmia.  Today, he denies symptoms of palpitations, chest pain, shortness of breath, orthopnea, PND, lower extremity edema, dizziness, presyncope, syncope, or neurologic sequela.  The patient feels that he is tolerating medications without difficulties and is otherwise without complaint today.   Past Medical History  Diagnosis Date  . Atrial flutter     Status post RFA 10/10 - Dr. Graciela Husbands, repeat ablation for recurrent by Dr Johney Frame 9/12  . Asthma   . Obstructive sleep apnea    Past Surgical History  Procedure Date  . Tonsillectomy   . Nasal sinus surgery   . Atrial ablation surgery     CTI ablation x 2    Current Outpatient Prescriptions  Medication Sig Dispense Refill  . cetirizine (ZYRTEC) 5 MG tablet Take 5 mg by mouth as needed.       . fluticasone (FLOVENT HFA) 110 MCG/ACT inhaler Inhale 1 puff into the lungs 2 (two) times daily.          No Known Allergies  History   Social History  . Marital Status: Married    Spouse Name: N/A    Number of Children: N/A  . Years of Education: N/A   Occupational History  . Not on file.   Social History Main Topics  . Smoking status: Former Smoker -- 1.0 packs/day for 20 years    Types: Cigarettes    Quit date: 11/17/2003  . Smokeless tobacco: Never Used  . Alcohol Use: Yes     On occasion   . Drug Use: No  . Sexually Active: Not on file   Other Topics Concern  . Not on file   Social History Narrative  . No narrative on file    Family History  Problem Relation Age of Onset  . Cancer Father     ROS-  All systems are reviewed and are negative except as outlined in the HPI above   Physical Exam: Filed Vitals:   07/24/11 1208  BP: 120/82  Pulse: 74  Height: 6' (1.829 m)  Weight: 252 lb  (114.306 kg)    GEN- The patient is overweight appearing, alert and oriented x 3 today.   Head- normocephalic, atraumatic Eyes-  Sclera clear, conjunctiva pink Ears- hearing intact Oropharynx- clear Neck- supple, no JVP Lymph- no cervical lymphadenopathy Lungs- Clear to ausculation bilaterally, normal work of breathing Heart- Regular rate and rhythm, no murmurs, rubs or gallops, PMI not laterally displaced GI- soft, NT, ND, + BS Extremities- no clubbing, cyanosis, trace edema  ekg today reveals sinus rhythm 74 bpm, PR 152, QRS 138, RBBB,   Assessment and Plan:

## 2011-08-03 ENCOUNTER — Encounter: Payer: Self-pay | Admitting: Internal Medicine

## 2011-09-20 ENCOUNTER — Ambulatory Visit (INDEPENDENT_AMBULATORY_CARE_PROVIDER_SITE_OTHER): Payer: Self-pay | Admitting: *Deleted

## 2011-09-20 VITALS — BP 106/70 | HR 132 | Temp 99.5°F

## 2011-09-20 DIAGNOSIS — I4892 Unspecified atrial flutter: Secondary | ICD-10-CM

## 2011-09-20 NOTE — Progress Notes (Signed)
Pt walked into the office requesting an EKG. He states for the past couple of days he has been feeling weak, tired and dizzy. He used his pulse ox machine today and noticed a HR of 120's-140's. His wife does state he has had 2 ticks on him in the past week. Pt will see primary MD for evaluation of recent ticks. EKG and VS from today will be sent to Drs. McDowell and Allred for further review. Pt agreeable to this plan.

## 2011-09-20 NOTE — Progress Notes (Signed)
Discussed with Dr. Diona Browner. He states to offer admission for management of abn EKG/rhythm. (Dr. Johney Frame is hospital DOD tomorrow and EP in hosp on Friday). Otherwise, if pt feels stable w/o major symptoms, he can come into the office to see Gene tomorrow am for outpatient management.  Left message to call back on voicemail to discuss.

## 2011-09-20 NOTE — Progress Notes (Signed)
Have reviewed tracing (patient done from office). Probable ectopic atrial tachycardia (or atypical A-flutter with 2:1), sinus tachycardia less likely with differing P-wave morphology compared to prior tracing. Duration is uncertain. Reportedly feeling poorly over the last few days. We should at least have him seen in the office tomorrow, could place him on Gene's schedule, to initiate medical therapy. He is no longer on Coumadin or any rate control medications. Alternatively, he could be admitted to the hospital for more acute management, possibly even TEE cardioversion.

## 2011-09-21 ENCOUNTER — Emergency Department (HOSPITAL_COMMUNITY)
Admission: EM | Admit: 2011-09-21 | Discharge: 2011-09-21 | Disposition: A | Discharge status: Home / Self Care | Payer: Self-pay | Attending: Internal Medicine | Admitting: Internal Medicine

## 2011-09-21 ENCOUNTER — Other Ambulatory Visit: Payer: Self-pay

## 2011-09-21 DIAGNOSIS — R5381 Other malaise: Secondary | ICD-10-CM | POA: Insufficient documentation

## 2011-09-21 DIAGNOSIS — I4891 Unspecified atrial fibrillation: Secondary | ICD-10-CM | POA: Insufficient documentation

## 2011-09-21 DIAGNOSIS — J45909 Unspecified asthma, uncomplicated: Secondary | ICD-10-CM | POA: Insufficient documentation

## 2011-09-21 DIAGNOSIS — I4892 Unspecified atrial flutter: Secondary | ICD-10-CM | POA: Insufficient documentation

## 2011-09-21 DIAGNOSIS — R509 Fever, unspecified: Secondary | ICD-10-CM | POA: Insufficient documentation

## 2011-09-21 DIAGNOSIS — R531 Weakness: Secondary | ICD-10-CM

## 2011-09-21 DIAGNOSIS — R110 Nausea: Secondary | ICD-10-CM | POA: Insufficient documentation

## 2011-09-21 DIAGNOSIS — R000 Tachycardia, unspecified: Secondary | ICD-10-CM | POA: Insufficient documentation

## 2011-09-21 DIAGNOSIS — R197 Diarrhea, unspecified: Secondary | ICD-10-CM | POA: Insufficient documentation

## 2011-09-21 DIAGNOSIS — R630 Anorexia: Secondary | ICD-10-CM | POA: Insufficient documentation

## 2011-09-21 DIAGNOSIS — R609 Edema, unspecified: Secondary | ICD-10-CM | POA: Insufficient documentation

## 2011-09-21 DIAGNOSIS — R42 Dizziness and giddiness: Secondary | ICD-10-CM | POA: Insufficient documentation

## 2011-09-21 DIAGNOSIS — G4733 Obstructive sleep apnea (adult) (pediatric): Secondary | ICD-10-CM | POA: Insufficient documentation

## 2011-09-21 LAB — BASIC METABOLIC PANEL
CO2: 26 mEq/L (ref 19–32)
Chloride: 104 mEq/L (ref 96–112)
GFR calc Af Amer: >90 mL/min (ref >90)
Potassium: 3.6 mEq/L (ref 3.5–5.1)

## 2011-09-21 LAB — DIFFERENTIAL
Basophils Absolute: 0 10*3/uL (ref 0.0–0.1)
Basophils Relative: 0 % (ref 0–1)
Lymphocytes Relative: 24 % (ref 12–46)
Monocytes Absolute: 0.6 10*3/uL (ref 0.1–1.0)
Neutro Abs: 2.2 10*3/uL (ref 1.7–7.7)
Neutrophils Relative %: 57 % (ref 43–77)

## 2011-09-21 LAB — MAGNESIUM: Magnesium: 1.7 mg/dL (ref 1.5–2.5)

## 2011-09-21 LAB — CBC
HCT: 46.6 % (ref 39.0–52.0)
Hemoglobin: 15.8 g/dL (ref 13.0–17.0)
MCHC: 33.9 g/dL (ref 30.0–36.0)
RDW: 14.1 % (ref 11.5–15.5)
WBC: 3.8 10*3/uL — ABNORMAL LOW (ref 4.0–10.5)

## 2011-09-21 LAB — TSH: TSH: 0.308 u[IU]/mL — ABNORMAL LOW (ref 0.350–4.500)

## 2011-09-21 LAB — T3, FREE: T3, Free: 2.2 pg/mL — ABNORMAL LOW (ref 2.3–4.2)

## 2011-09-21 NOTE — Progress Notes (Signed)
Pt decided to go to [facility] for further evaluation.

## 2011-09-21 NOTE — ED Provider Notes (Signed)
Pt seen and evaluated in Stretcher Triage.  He is being seen primarily in the ED by Dr. Johney Frame and Theodore Demark of [facility] cardiology.  Pt has had generalized malaise over the past few weeks.  Has a hx of atrial fib/flutter- had sinus tachycardia on EKG yesterday at cardiologist's office.  Today he came to ED for evaluation.  Pt awake alert, in no distress.  Lungs CTA.  Labs ordered by [employer].  Their plan is to review the labs and decide on disposition at that time.    Ethelda Chick, MD 09/25/11 1515

## 2011-09-21 NOTE — ED Notes (Signed)
Pt to be seen by [place] in stretcher triage and then pt can go to yellow while awaiting admission orders to go to floor.

## 2011-09-21 NOTE — Progress Notes (Signed)
Pt's wife called the office. She is still not sure what they will do regarding heart rhythm. She states last night pt continued to have chills and low grade fever. He was started on doxycyline last night by primary MD. She has not heard any lab results from primary MD regarding evaluation of Lyme disease. Discussed options of outpt vs inpt management w/pt's wife again per Dr. Diona Browner. She states she will d/w pt.

## 2011-09-21 NOTE — Consult Note (Signed)
Consult Note  Patient ID: Tenna Delaine Patient ID: TYRECE VANTERPOOL MRN: [id number], DOB/AGE: 1961/05/08 51 y.o. Date of Encounter: 09/21/2011  Primary Physician: Fredirick Maudlin, MD, MD Primary Cardiologist: Fawn Kirk  Chief Complaint: Palpitations  HPI: Mr. Whitmoyer is a 51 year old male with a history of atrial flutter. He describes a several month period of worsening general malaise. He saw his primary care physician and is being evaluated, as well as receiving treatment for, possible Lyme disease. The patient started on doxycycline for this yesterday. His symptoms have been gradually worsened and last p.m., for the first time, he had chills and a low-grade fever. He also has had a total of 4 episodes of diarrhea in the last 24 hours. He feels that his by mouth intake has been poor and he feels weak. He has not had chest pain. Yesterday he he felt his heart rate was elevated so he went to the office where an ECG, reviewed by Dr Diona Browner and Dr. Johney Frame, showed sinus tachycardia. He has felt his heart rate elevated at times but has not felt that he palpitations that he had previously which were associated with the atrial flutter. He complains of some orthostatic dizziness but has had no presyncope or syncope.  Past Medical History  Diagnosis Date  . Atrial flutter     Status post RFA 10/10 - Dr. Graciela Husbands, repeat ablation for recurrent by Dr Johney Frame 9/12  . Asthma    Morbid obesity   . Obstructive sleep apnea on CPAP      Surgical History:  Past Surgical History  Procedure Date  . Tonsillectomy   . Nasal sinus surgery   . Atrial ablation surgery     CTI ablation x 2    I have reviewed the patient's current medications. Prior to Admission:  1. Zyrtec 5 mg daily when necessary 2. Fluticasone inhaler twice a day  Allergies: No Known Allergies  History   Social History  . Marital Status: Married    Spouse Name: N/A    Number of Children: N/A  . Years of Education: N/A   Occupational  History  . Construction   Social History Main Topics  . Smoking status: Former Smoker -- 1.0 packs/day for 20 years    Types: Cigarettes    Quit date: 11/17/2003  . Smokeless tobacco: Never Used  . Alcohol Use: Yes     On occasion   . Drug Use: No  . Sexually Active: Not on file   Family History  Problem Relation Age of Onset  . Cancer Father    Physical Exam: Blood pressure 128/80, resp. rate 16, SpO2 99.00%. General: Well developed, well nourished, obese white male, in no acute distress. Head: Normocephalic, atraumatic, sclera non-icteric, no xanthomas, nares are without discharge.  Neck: Negative for carotid bruits. JVD not elevated but difficult to assess secondary to body habitus. No thyromegally Lungs: Clear bilaterally to auscultation without wheezes, rales, or rhonchi. Breathing is unlabored. Heart: RRR with S1 S2. No clinically significant murmurs, rubs, or gallops appreciated. Abdomen: Soft, non-tender, non-distended with normoactive bowel sounds. No hepatomegaly. No rebound/guarding. No obvious abdominal masses. Msk:  Strength and tone appear normal for age. Extremities: No clubbing or cyanosis. Trace LE edema.  Distal pedal pulses are 2+ and equal bilaterally. Neuro: Alert and oriented X 3. Moves all extremities spontaneously. No focal deficits noted. Psych:  Responds to questions appropriately with a normal affect. Skin: No rashes or lesions noted  Review of Systems: Temp 99.4 yesterday, no  productive cough, some chills yesterday, OK today. Not quite as weak today. No feelings like he had when he had flutter, although he feels his heart pound at times. When he felt his heart pound, yesterday in the office, he was in sinus tachycardia. He complains of some orthostatic dizziness but no presyncope. He has had some nausea and poor by mouth intake but has kept liquids down. He he has eaten very little solid food in the last few days because of the nausea. He has some  musculoskeletal aches and pains. He has not had significant reflux symptoms or melena. All other systems reviewed and are otherwise negative except as noted above.  Labs:  Lab Results  Component Value Date   WBC 3.8* 09/21/2011   HGB 15.8 09/21/2011   HCT 46.6 09/21/2011   PLT PENDING 09/21/2011         EKG: Sinus rhythm, right bundle branch block and LAHB  ASSESSMENT AND PLAN:  Mr. Carreon was evaluated today by Dr. Johney Frame. The data were reviewed. His symptoms are more likely from his ongoing illness which is being evaluated and treated by his primary care physician. There is no documented arrhythmia and currently no reason for admission. He will have orthostatic vital signs checked and basic labs to insure that there is no critical abnormality. We will check thyroid studies as well. If his electrolytes are stable, he will be discharged home, to followup as an outpatient. The thyroid studies can be followed up as an outpatient.  Signed, Bjorn Loser Barrett PA-C 09/21/2011, 4:05 PM  Addendum: Emergency room nurse contacted me and advised that his orthostatic vital signs showed no significant change in blood pressure or heart rate with position change. The patient is currently feeling a better. His ECG was reviewed and he is maintaining sinus rhythm. His labs showed no critical abnormalities. He is therefore stable for discharge, to followup as an outpatient.  I have seen, examined the patient, and reviewed the above assessment and plan of Ms Barrett as above.  The patient was referred by Dr Diona Browner to the ER for further evaluation of tachycardia.  EKG from the office reveals long RP tachycardia at 120 bpm.  Though the p wave morphology is not typical of sinus rhythm in I, VL, the QRS morphology in I, VL is also different when compared to today.  I suspect that the office ekg was sinus, though I cannot exclude an ectopic atrial tachycardia.  Today, the patient is clearly in sinus rhythm.  I would not make  any arrhythmic medication changes at this time.  GIven RBBB/LAHB, I would like to also avoid more aggressive AV nodal blockade. We will check BMET , CBC, and TFTs as above.  If not significantly abnormal, he can be discharged with outpatient follow-up. Hillis Range MD 8:30 PM 09/21/2011   Co Sign: Hillis Range, MD 09/21/2011 8:27 PM

## 2011-09-21 NOTE — ED Notes (Signed)
Patient is resting comfortably. Wife at bedside.  Awaiting disposition.

## 2011-09-21 NOTE — ED Notes (Signed)
A/O x 3, NAD.  Gait steady.  Pt denies pain/discomfort at this time.

## 2011-09-21 NOTE — Progress Notes (Signed)
Pt's wife notified of Dr. Ival Bible recommendations of office visit vs. admission on the evening of 09/20/11. She will d/w pt and notify the office tomorrow of their decision. She did state his primary MD is testing him for Lyme disease.

## 2011-10-17 ENCOUNTER — Encounter: Payer: Self-pay | Admitting: *Deleted

## 2011-10-17 NOTE — Progress Notes (Signed)
11/05/09 I-PSS results=[id number] score=10

## 2011-10-18 ENCOUNTER — Ambulatory Visit (INDEPENDENT_AMBULATORY_CARE_PROVIDER_SITE_OTHER): Payer: Self-pay | Admitting: Internal Medicine

## 2011-10-18 ENCOUNTER — Encounter: Payer: Self-pay | Admitting: Internal Medicine

## 2011-10-18 DIAGNOSIS — G4733 Obstructive sleep apnea (adult) (pediatric): Secondary | ICD-10-CM

## 2011-10-18 DIAGNOSIS — R000 Tachycardia, unspecified: Secondary | ICD-10-CM

## 2011-10-18 NOTE — Progress Notes (Signed)
PCP:  Fredirick Maudlin, MD, MD Primary Cardiologist:  Dr Diona Browner  The patient presents today for electrophysiology followup.  I recently saw him at [facility] ER with symptoms of palpitations.  At that time, he was noted to have sinus tachycardia.  Since that time, the patient reports doing very well.  His symptoms have resolved.  He has fatigue, worse with asthma.  He does not feel well rested at times but uses his CPAP every night.  Today, he denies symptoms of chest pain, shortness of breath, orthopnea, PND, lower extremity edema, dizziness, presyncope, syncope, or neurologic sequela.  The patient feels that he is tolerating medications without difficulties and is otherwise without complaint today.   Past Medical History  Diagnosis Date  . Atrial flutter     Status post RFA 10/10 - Dr. Graciela Husbands, repeat ablation for recurrent by Dr Johney Frame 9/12  . Asthma   . Obstructive sleep apnea   . Bifascicular block     chronic (since at least 2010)   Past Surgical History  Procedure Date  . Tonsillectomy   . Nasal sinus surgery   . Atrial ablation surgery     CTI ablation x 2    Current Outpatient Prescriptions  Medication Sig Dispense Refill  . fluticasone (FLOVENT HFA) 110 MCG/ACT inhaler Inhale 1 puff into the lungs 2 (two) times daily.        Marland Kitchen ibuprofen (ADVIL,MOTRIN) 200 MG tablet Take 400 mg by mouth every 6 (six) hours as needed. FOR PAIN         No Known Allergies  History   Social History  . Marital Status: Married    Spouse Name: N/A    Number of Children: N/A  . Years of Education: N/A   Occupational History  . Not on file.   Social History Main Topics  . Smoking status: Former Smoker -- 1.0 packs/day for 20 years    Types: Cigarettes    Quit date: 11/17/2003  . Smokeless tobacco: Never Used  . Alcohol Use: Yes     On occasion   . Drug Use: No  . Sexually Active: Not on file   Other Topics Concern  . Not on file   Social History Narrative  . No narrative on file     Family History  Problem Relation Age of Onset  . Cancer Father     ROS-  All systems are reviewed and are negative except as outlined in the HPI above   Physical Exam: Filed Vitals:   10/18/11 1047  BP: 117/76  Pulse: 79  Height: 5\' 11"  (1.803 m)  Weight: 345 lb (156.491 kg)    GEN- The patient is morbidly obese appearing, alert and oriented x 3 today.   Head- normocephalic, atraumatic Eyes-  Sclera clear, conjunctiva pink Ears- hearing intact Oropharynx- clear Neck- supple, no JVP Lymph- no cervical lymphadenopathy Lungs- Clear to ausculation bilaterally, normal work of breathing Heart- Regular rate and rhythm, no murmurs, rubs or gallops, PMI not laterally displaced GI- soft, NT, ND, + BS Extremities- no clubbing, cyanosis, or edema MS- no significant deformity or atrophy Skin- no rash or lesion Psych- euthymic mood, full affect Neuro- strength and sensation are intact  ekg today reveals sinus rhythm 80 bpm with bifascicular block  Assessment and Plan:

## 2011-10-18 NOTE — Assessment & Plan Note (Signed)
Resolved Given bifascicular block, I would recommend that we avoid AV nodal blocking agents

## 2011-10-18 NOTE — Assessment & Plan Note (Signed)
I have encouraged him to follow-up with Dr Juanetta Gosling.  He may need further titration of CPAP as this could be the cause for his fatigue.

## 2011-10-18 NOTE — Assessment & Plan Note (Signed)
Weight loss discussed at length today  TSH and free T3 were both slightly below normal range in the ER recently.  I have encouraged him to have this repeated by Dr Juanetta Gosling.

## 2012-07-03 ENCOUNTER — Encounter (HOSPITAL_COMMUNITY): Payer: Self-pay | Admitting: Emergency Medicine

## 2012-07-03 DIAGNOSIS — Z87891 Personal history of nicotine dependence: Secondary | ICD-10-CM | POA: Insufficient documentation

## 2012-07-03 DIAGNOSIS — R609 Edema, unspecified: Secondary | ICD-10-CM | POA: Insufficient documentation

## 2012-07-03 DIAGNOSIS — I4891 Unspecified atrial fibrillation: Secondary | ICD-10-CM | POA: Insufficient documentation

## 2012-07-03 DIAGNOSIS — G4733 Obstructive sleep apnea (adult) (pediatric): Secondary | ICD-10-CM | POA: Insufficient documentation

## 2012-07-03 DIAGNOSIS — J45909 Unspecified asthma, uncomplicated: Secondary | ICD-10-CM | POA: Insufficient documentation

## 2012-07-03 DIAGNOSIS — Z6841 Body Mass Index (BMI) 40.0 and over, adult: Secondary | ICD-10-CM | POA: Insufficient documentation

## 2012-07-03 DIAGNOSIS — I452 Bifascicular block: Secondary | ICD-10-CM | POA: Insufficient documentation

## 2012-07-03 DIAGNOSIS — D696 Thrombocytopenia, unspecified: Secondary | ICD-10-CM | POA: Insufficient documentation

## 2012-07-03 DIAGNOSIS — E669 Obesity, unspecified: Secondary | ICD-10-CM | POA: Insufficient documentation

## 2012-07-03 NOTE — ED Notes (Signed)
Patient complaining of swelling and pain in lower left leg x 2 days. Noted redness and swelling to lower left leg at triage.

## 2012-07-04 ENCOUNTER — Emergency Department (HOSPITAL_COMMUNITY)
Admission: EM | Admit: 2012-07-04 | Discharge: 2012-07-04 | Disposition: A | Discharge status: Home / Self Care | Payer: BC Managed Care – PPO | Attending: Emergency Medicine | Admitting: Emergency Medicine

## 2012-07-04 ENCOUNTER — Ambulatory Visit (HOSPITAL_COMMUNITY)
Admit: 2012-07-04 | Discharge: 2012-07-04 | Disposition: A | Discharge status: Home / Self Care | Payer: BC Managed Care – PPO | Attending: Emergency Medicine | Admitting: Emergency Medicine

## 2012-07-04 DIAGNOSIS — M7989 Other specified soft tissue disorders: Secondary | ICD-10-CM | POA: Insufficient documentation

## 2012-07-04 DIAGNOSIS — M79609 Pain in unspecified limb: Secondary | ICD-10-CM | POA: Insufficient documentation

## 2012-07-04 LAB — CBC WITH DIFFERENTIAL/PLATELET
Basophils Relative: 0 % (ref 0–1)
Eosinophils Absolute: 0.3 10*3/uL (ref 0.0–0.7)
Eosinophils Relative: 4 % (ref 0–5)
Lymphs Abs: 2.2 10*3/uL (ref 0.7–4.0)
MCH: 30.3 pg (ref 26.0–34.0)
MCHC: 34.6 g/dL (ref 30.0–36.0)
MCV: 87.7 fL (ref 78.0–100.0)
Neutrophils Relative %: 57 % (ref 43–77)
Platelets: 133 10*3/uL — ABNORMAL LOW (ref 150–400)
RBC: 5.11 MIL/uL (ref 4.22–5.81)
RDW: 13.3 % (ref 11.5–15.5)

## 2012-07-04 LAB — BASIC METABOLIC PANEL
Calcium: 9.7 mg/dL (ref 8.4–10.5)
GFR calc Af Amer: >90 mL/min (ref >90)
GFR calc non Af Amer: >90 mL/min (ref >90)
Glucose, Bld: 115 mg/dL — ABNORMAL HIGH (ref 70–99)
Sodium: 139 mEq/L (ref 135–145)

## 2012-07-04 LAB — PROTIME-INR: Prothrombin Time: 12.8 seconds (ref 11.6–15.2)

## 2012-07-04 LAB — APTT: aPTT: 31 seconds (ref 24–37)

## 2012-07-04 MED ORDER — SULFAMETHOXAZOLE-TMP DS 800-160 MG PO TABS
1.0000 | ORAL_TABLET | Freq: Once | ORAL | Status: AC
  Administered 2012-07-04: 1 via ORAL
  Filled 2012-07-04: qty 1

## 2012-07-04 MED ORDER — SULFAMETHOXAZOLE-TRIMETHOPRIM 800-160 MG PO TABS: 1.0000 | ORAL_TABLET | Freq: Two times a day (BID) | ORAL | Status: DC

## 2012-07-04 MED ORDER — ENOXAPARIN SODIUM 30 MG/0.3ML ~~LOC~~ SOLN
1.0000 mg/kg | Freq: Once | SUBCUTANEOUS | Status: AC
  Administered 2012-07-04: 150 mg via SUBCUTANEOUS

## 2012-07-04 MED ORDER — ENOXAPARIN SODIUM 120 MG/0.8ML ~~LOC~~ SOLN
SUBCUTANEOUS | Status: AC
  Filled 2012-07-04: qty 0.8

## 2012-07-04 MED ORDER — CEPHALEXIN 500 MG PO CAPS
500.0000 mg | ORAL_CAPSULE | Freq: Once | ORAL | Status: AC
  Administered 2012-07-04: 500 mg via ORAL
  Filled 2012-07-04: qty 1

## 2012-07-04 MED ORDER — NAPROXEN 500 MG PO TABS: 500.0000 mg | ORAL_TABLET | Freq: Two times a day (BID) | ORAL | Status: DC

## 2012-07-04 MED ORDER — CEPHALEXIN 500 MG PO CAPS: 500.0000 mg | ORAL_CAPSULE | Freq: Four times a day (QID) | ORAL | Status: DC

## 2012-07-04 NOTE — ED Provider Notes (Signed)
History     CSN: [id number]  Arrival date & time 07/03/12  2238   First MD Initiated Contact with Patient 07/04/12 0023      Chief Complaint  Patient presents with  . Leg Pain    (Consider location/radiation/quality/duration/timing/severity/associated sxs/prior treatment) HPI Comments: 51 year old male with a history of obesity and atrial flutter who presents from home with a complaint of left lower extremity redness and swelling. The patient states this started 2 days ago cough was gradual in onset and started with some redness and swelling to the left leg above the knee on the medial surface. Over the last 48 hours this has spread distally to the medial calf where it is associated with redness and warmth. This is gradually getting worse, not associated with fevers chills nausea vomiting difficulty breathing or chest pain. He has no history of venous thromboembolism, he recently was somewhat immobilized in a road trip to the beach which took approximately 4-5 hours of driving each way. He denies any trauma or recent surgery, is a former smoker but has not smoked in several years, has no history of cancer, denies hormone therapy, diabetes, immunosuppression.  Patient is a 51 y.o. male presenting with leg pain. The history is provided by the patient and the spouse.  Leg Pain     Past Medical History  Diagnosis Date  . Atrial flutter     Status post RFA 10/10 - Dr. Graciela Husbands, repeat ablation for recurrent by Dr Johney Frame 9/12  . Asthma   . Obstructive sleep apnea   . Bifascicular block     chronic (since at least 2010)    Past Surgical History  Procedure Date  . Tonsillectomy   . Nasal sinus surgery   . Atrial ablation surgery     CTI ablation x 2    Family History  Problem Relation Age of Onset  . Cancer Father     History  Substance Use Topics  . Smoking status: Former Smoker -- 1.0 packs/day for 20 years    Types: Cigarettes    Quit date: 11/17/2003  . Smokeless tobacco:  Never Used  . Alcohol Use: No     On occasion       Review of Systems  All other systems reviewed and are negative.    Allergies  Review of patient's allergies indicates no known allergies.  Home Medications   Current Outpatient Rx  Name Route Sig Dispense Refill  . CEPHALEXIN 500 MG PO CAPS Oral Take 1 capsule (500 mg total) by mouth 4 (four) times daily. 40 capsule 0  . FLUTICASONE PROPIONATE  HFA 110 MCG/ACT IN AERO Inhalation Inhale 1 puff into the lungs 2 (two) times daily.      . IBUPROFEN 200 MG PO TABS Oral Take 400 mg by mouth every 6 (six) hours as needed. FOR PAIN     . NAPROXEN 500 MG PO TABS Oral Take 1 tablet (500 mg total) by mouth 2 (two) times daily with a meal. 30 tablet 0  . SULFAMETHOXAZOLE-TRIMETHOPRIM 800-160 MG PO TABS Oral Take 1 tablet by mouth every 12 (twelve) hours. 20 tablet 0    BP 133/89  Pulse 78  Temp 98.4 F (36.9 C) (Oral)  Resp 20  Ht 5\' 11"  (1.803 m)  Wt 335 lb (151.955 kg)  BMI 46.72 kg/m2  SpO2 99%  Physical Exam  Nursing note and vitals reviewed. Constitutional: He appears well-developed and well-nourished. No distress.  HENT:  Head: Normocephalic and atraumatic.  Mouth/Throat:  Oropharynx is clear and moist. No oropharyngeal exudate.  Eyes: Conjunctivae normal and EOM are normal. Pupils are equal, round, and reactive to light. Right eye exhibits no discharge. Left eye exhibits no discharge. No scleral icterus.  Neck: Normal range of motion. Neck supple. No JVD present. No thyromegaly present.  Cardiovascular: Normal rate, regular rhythm, normal heart sounds and intact distal pulses.  Exam reveals no gallop and no friction rub.   No murmur heard. Pulmonary/Chest: Effort normal and breath sounds normal. No respiratory distress. He has no wheezes. He has no rales.  Abdominal: Soft. Bowel sounds are normal. He exhibits no distension and no mass. There is no tenderness.  Musculoskeletal: Normal range of motion. He exhibits tenderness  (mild tenderness to the left medial lower extremity below the knee associated with erythema and warmth). He exhibits no edema.       Minimal pitting edema to the left lower extremity below the knee, mild asymmetry, mild erythema above the knee on the left on the medial side of the leg  Lymphadenopathy:    He has no cervical adenopathy.  Neurological: He is alert. Coordination normal.  Skin: Skin is warm and dry. No rash noted. There is erythema.  Psychiatric: He has a normal mood and affect. His behavior is normal.    ED Course  Procedures (including critical care time)  Labs Reviewed  CBC WITH DIFFERENTIAL - Abnormal; Notable for the following:    Platelets 133 (*)     All other components within normal limits  BASIC METABOLIC PANEL - Abnormal; Notable for the following:    Glucose, Bld 115 (*)     All other components within normal limits  APTT  PROTIME-INR   No results found.   1. Swelling of left lower extremity       MDM  Would consider both deep venous thrombosis as well as a cellulitis as possibilities for the patient's etiology. He has no fever, no tachycardia and is not in atrial fibrillation for which he has a history of. At this time will evaluate for DVT with ultrasound in the morning, will start anticoagulants to see, antibiotics until ultrasound proves otherwise.  The patient is afebrile, his blood counts are normal at 7400, he has a chronic thrombocytopenia at 133 and normal renal function. His coagulation studies are normal and he has been given Lovenox in preparation for an ultrasound in the morning. The patient has an understanding of the indications for return should his symptoms worsen otherwise she will return for his ultrasound. Antibiotics will be given pending the ultrasound results. He appears stable for discharge this evening   Discharge Prescriptions include:  Naprosyn Keflex Bactrim    Vida Roller, MD 07/04/12 [phone]

## 2012-07-04 NOTE — ED Notes (Signed)
Swelling and redness on Lt leg extends from lower calf to upper thigh

## 2012-07-04 NOTE — ED Notes (Signed)
Discharge instructions reviewed with pt, questions answered. Pt verbalized understanding.  

## 2012-10-23 ENCOUNTER — Ambulatory Visit (INDEPENDENT_AMBULATORY_CARE_PROVIDER_SITE_OTHER): Payer: PRIVATE HEALTH INSURANCE | Admitting: Adult Health

## 2012-10-23 ENCOUNTER — Encounter: Payer: Self-pay | Admitting: Adult Health

## 2012-10-23 ENCOUNTER — Telehealth: Payer: Self-pay | Admitting: Physician Assistant

## 2012-10-23 VITALS — BP 120/75 | HR 78 | Ht 71.0 in | Wt 347.2 lb

## 2012-10-23 DIAGNOSIS — R002 Palpitations: Secondary | ICD-10-CM

## 2012-10-23 DIAGNOSIS — I4891 Unspecified atrial fibrillation: Secondary | ICD-10-CM

## 2012-10-23 DIAGNOSIS — I10 Essential (primary) hypertension: Secondary | ICD-10-CM

## 2012-10-23 DIAGNOSIS — G4733 Obstructive sleep apnea (adult) (pediatric): Secondary | ICD-10-CM

## 2012-10-23 DIAGNOSIS — R0609 Other forms of dyspnea: Secondary | ICD-10-CM

## 2012-10-23 DIAGNOSIS — R0989 Other specified symptoms and signs involving the circulatory and respiratory systems: Secondary | ICD-10-CM

## 2012-10-23 NOTE — Assessment & Plan Note (Signed)
Weight loss is recommended for better sleep hygiene and overall health improvement.

## 2012-10-23 NOTE — Patient Instructions (Addendum)
Your physician recommends that you schedule a follow-up appointment in: ONE MONTH  Your physician has recommended that you wear an event monitor. Event monitors are medical devices that record the heart's electrical activity. Doctors most often [place] these monitors to diagnose arrhythmias. Arrhythmias are problems with the speed or rhythm of the heartbeat. The monitor is a small, portable device. You can wear one while you do your normal daily activities. This is usually used to diagnose what is causing palpitations/syncope (passing out). TIL 11-13-12  Your physician has requested that you have an echocardiogram. Echocardiography is a painless test that uses sound waves to create images of your heart. It provides your doctor with information about the size and shape of your heart and how well your heart's chambers and valves are working. This procedure takes approximately one hour. There are no restrictions for this procedure.

## 2012-10-23 NOTE — Progress Notes (Deleted)
Name: Elijah Beard    DOB: December 06, 1960  Age: 52 y.o.  MR#: [id number]       PCP:  Fredirick Maudlin, MD      Insurance: @PAYORNAME @   CC:   LAST NIGHT PT NOTED A RAPID HEART BEAT FOR APROX TEN MINUTES, THEN HAPPENED AGAIN FOR 15 MINUTES, AND LAST TIME  LASTED 12 HOURS, ADVISED HAS PULSE OX AT HOME AND RANGE FROM 60'S TO 140'S IN RANGE OF HR, CEASED THIS AM 11AM, PT ADVISED HIS ASTHMA IS REALLY AGGRAVATED WITH THIS COLD WEATHER, AND BREATH WAS REALLY SHORT LAST NIGHT WITH THE HR FLUCTUATING   VS BP 120/75  Pulse 78  Ht 5\' 11"  (1.803 m)  Wt 347 lb 4 oz (157.512 kg)  BMI 48.43 kg/m2  SpO2 97%  Weights Current Weight  10/23/12 347 lb 4 oz (157.512 kg)  07/03/12 335 lb (151.955 kg)  10/18/11 345 lb (156.491 kg)    Blood Pressure  BP Readings from Last 3 Encounters:  10/23/12 120/75  07/03/12 133/89  10/18/11 117/76     Admit date:  (Not on file) Last encounter with RMR:  Visit date not found   Allergy No Known Allergies  Current Outpatient Prescriptions  Medication Sig Dispense Refill  . fluticasone (FLOVENT HFA) 110 MCG/ACT inhaler Inhale 1 puff into the lungs 2 (two) times daily.        Marland Kitchen ibuprofen (ADVIL,MOTRIN) 200 MG tablet Take 400 mg by mouth every 6 (six) hours as needed. FOR PAIN       . naproxen (NAPROSYN) 500 MG tablet Take 1 tablet (500 mg total) by mouth 2 (two) times daily with a meal.  30 tablet  0    Discontinued Meds:    Medications Discontinued During This Encounter  Medication Reason  . cephALEXin (KEFLEX) 500 MG capsule Error  . sulfamethoxazole-trimethoprim (SEPTRA DS) 800-160 MG per tablet Error    Patient Active Problem List  Diagnosis  . MORBID OBESITY  . OBSTRUCTIVE SLEEP APNEA  . ATRIAL FIBRILLATION  . ATRIAL FLUTTER  . ASTHMA  . PERS HX NONCOMPLIANCE W/MED TX PRS HAZARDS HLTH  . Encounter for long-term (current) use of anticoagulants  . Tachycardia    LABS No visits with results within 3 Month(s) from this visit. Latest known visit  with results is:  Admission on 07/04/2012, Discharged on 07/04/2012  Component Date Value  . aPTT 07/04/2012 31   . Prothrombin Time 07/04/2012 12.8   . INR 07/04/2012 0.97   . WBC 07/04/2012 7.4   . RBC 07/04/2012 5.11   . Hemoglobin 07/04/2012 15.5   . HCT 07/04/2012 44.8   . MCV 07/04/2012 87.7   . [facility] 07/04/2012 30.3   . MCHC 07/04/2012 34.6   . RDW 07/04/2012 13.3   . Platelets 07/04/2012 133*  . Neutrophils Relative 07/04/2012 57   . Neutro Abs 07/04/2012 4.2   . Lymphocytes Relative 07/04/2012 30   . Lymphs Abs 07/04/2012 2.2   . Monocytes Relative 07/04/2012 9   . Monocytes Absolute 07/04/2012 0.7   . Eosinophils Relative 07/04/2012 4   . Eosinophils Absolute 07/04/2012 0.3   . Basophils Relative 07/04/2012 0   . Basophils Absolute 07/04/2012 0.0   . Sodium 07/04/2012 139   . Potassium 07/04/2012 3.8   . Chloride 07/04/2012 104   . CO2 07/04/2012 28   . Glucose, Bld 07/04/2012 115*  . BUN 07/04/2012 18   . Creatinine, Ser 07/04/2012 0.98   . Calcium 07/04/2012 9.7   .  GFR calc non Af Amer 07/04/2012 >90   . GFR calc Af Amer 07/04/2012 >90      Results for this Opt Visit:     Results for orders placed during the hospital encounter of 07/04/12  APTT      Component Value Range   aPTT 31  24 - 37 seconds  PROTIME-INR      Component Value Range   Prothrombin Time 12.8  11.6 - 15.2 seconds   INR 0.97  0.00 - 1.49  CBC WITH DIFFERENTIAL      Component Value Range   WBC 7.4  4.0 - 10.5 K/uL   RBC 5.11  4.22 - 5.81 MIL/uL   Hemoglobin 15.5  13.0 - 17.0 g/dL   HCT 16.1  09.6 - 04.5 %   MCV 87.7  78.0 - 100.0 fL   MCH 30.3  26.0 - 34.0 pg   MCHC 34.6  30.0 - 36.0 g/dL   RDW 40.9  81.1 - 91.4 %   Platelets 133 (*) 150 - 400 K/uL   Neutrophils Relative 57  43 - 77 %   Neutro Abs 4.2  1.7 - 7.7 K/uL   Lymphocytes Relative 30  12 - 46 %   Lymphs Abs 2.2  0.7 - 4.0 K/uL   Monocytes Relative 9  3 - 12 %   Monocytes Absolute 0.7  0.1 - 1.0 K/uL   Eosinophils  Relative 4  0 - 5 %   Eosinophils Absolute 0.3  0.0 - 0.7 K/uL   Basophils Relative 0  0 - 1 %   Basophils Absolute 0.0  0.0 - 0.1 K/uL  BASIC METABOLIC PANEL      Component Value Range   Sodium 139  135 - 145 mEq/L   Potassium 3.8  3.5 - 5.1 mEq/L   Chloride 104  96 - 112 mEq/L   CO2 28  19 - 32 mEq/L   Glucose, Bld 115 (*) 70 - 99 mg/dL   BUN 18  6 - 23 mg/dL   Creatinine, Ser 7.82  0.50 - 1.35 mg/dL   Calcium 9.7  8.4 - 95.6 mg/dL   GFR calc non Af Amer >90  >90 mL/min   GFR calc Af Amer >90  >90 mL/min    EKG Orders placed in visit on 10/23/12  . EKG 12-LEAD     Prior Assessment and Plan Problem List as of 10/23/2012          MORBID OBESITY   Last Assessment & Plan Note   10/18/2011 Office Visit Signed 10/18/2011 11:19 AM by Hillis Range, MD    Weight loss discussed at length today  TSH and free T3 were both slightly below normal range in the ER recently.  I have encouraged him to have this repeated by Dr Juanetta Gosling.    OBSTRUCTIVE SLEEP APNEA   Last Assessment & Plan Note   10/18/2011 Office Visit Signed 10/18/2011 11:18 AM by Hillis Range, MD    I have encouraged him to follow-up with Dr Juanetta Gosling.  He may need further titration of CPAP as this could be the cause for his fatigue.    ATRIAL FIBRILLATION   ATRIAL FLUTTER   Last Assessment & Plan Note   07/24/2011 Office Visit Signed 07/24/2011  1:42 PM by Hillis Range, MD    Doing well s/p ablation He has already stopped coumadin  No further workup planned  Return as needed  Compliance with weight loss and CPAP were encouraged  ASTHMA   PERS HX NONCOMPLIANCE W/MED TX PRS HAZARDS HLTH   Encounter for long-term (current) use of anticoagulants   Last Assessment & Plan Note   03/23/2011 Office Visit Signed 03/23/2011 10:49 AM by Wendall Stade, MD    INR Rx last two weeks.  Continue to check weekly until [facility].  Mild bleeding in gums but nothing else    Tachycardia   Last Assessment & Plan Note   10/18/2011 Office Visit  Signed 10/18/2011 11:18 AM by Hillis Range, MD    Resolved Given bifascicular block, I would recommend that we avoid AV nodal blocking agents        Imaging: No results found.   FRS Calculation: Score not calculated. Missing: Total Cholesterol

## 2012-10-23 NOTE — Assessment & Plan Note (Signed)
He continues to use CPAP at HS. Has not been sleeping well.

## 2012-10-23 NOTE — Telephone Encounter (Signed)
Wife called stating that patient noticed last night that he was having symptoms of rapid heart beat,& sob. No c/o chest pain. Patient say's he thinks he is back in atrial fibrillation. Nurse informed patient that patient did need to be evaluated by a provider and she would be called back with appointment information. Nurse called [place] office to check availability and was given an appointment for today @2 :20 pm with Joni Reining NP. Wife called and informed. [phone].

## 2012-10-23 NOTE — Assessment & Plan Note (Signed)
Currently in NSR but has had self reported episodic rapid HR yesterday with longest lasting approximatley 10 hours. He states he has had episodes 1-4 times a month, but never lasting as long as it did last evening. I will place 21 day cardiac monitor on the patient to evaluate for recurrence of atrial tachycardia, SVT or tachycardia. He will also have an echocardiogram completed. Last echo was in 2012 with EF of 55%-60%, no WMA or LVH was seen at that time. He will return in 1 month, but has been advised to go to ER for recurrent elevation in HR that lasts longer than an hours.   He admits to drinking caffeine during the day, 2 16 oz Pepsi's and several iced tea drinks. I have asked him to cut down on his caffeine consumption.

## 2012-10-23 NOTE — Progress Notes (Signed)
   HPI: [name] is a 52 y/o patient of Dr.s Diona Browner and  Allred we are following for ongoing assessment and treatment of recurrent tachy atrial arrhythmia s/p RFA on 10/10 with repeat albation by Dr.Allred 9/12. He comes today as an add on to the office due to recurrent rapid heart rate that occurred on and off yesterday. He has two episodes while walking around in his house lasting approximately 10 minutes each, and resolving on its own. He had a third episode beginning around 10 pm last night, and lasting into the next morning until around 11am. He called our office for advisement. He states that he has some uncomfortable feeling in his chest, mild pressure and some associated trouble taking deep breaths during the episodes. No frank chest pain, dizziness or pre-syncope. He is not on any rate reducing medications, and per Dr. Jenel Lucks note, he is not to be on AV blocking agents in the setting of bifascicular block.  Other history includes OSA with use of CPAP, and asthma.   No Known Allergies  Current Outpatient Prescriptions  Medication Sig Dispense Refill  . fluticasone (FLOVENT HFA) 110 MCG/ACT inhaler Inhale 1 puff into the lungs 2 (two) times daily.        Marland Kitchen ibuprofen (ADVIL,MOTRIN) 200 MG tablet Take 400 mg by mouth every 6 (six) hours as needed. FOR PAIN       . naproxen (NAPROSYN) 500 MG tablet Take 1 tablet (500 mg total) by mouth 2 (two) times daily with a meal.  30 tablet  0    Past Medical History  Diagnosis Date  . Atrial flutter     Status post RFA 10/10 - Dr. Graciela Husbands, repeat ablation for recurrent by Dr Johney Frame 9/12  . Asthma   . Obstructive sleep apnea   . Bifascicular block     chronic (since at least 2010)    Past Surgical History  Procedure Date  . Tonsillectomy   . Nasal sinus surgery   . Atrial ablation surgery     CTI ablation x 2    ROS: Review of systems complete and found to be negative unless listed above  PHYSICAL EXAM BP 120/75  Pulse 78  Ht 5\' 11"   (1.803 m)  Wt 347 lb 4 oz (157.512 kg)  BMI 48.43 kg/m2  SpO2 97%  General: Well developed, well nourished, in no acute distress Head: Eyes PERRLA, No xanthomas.   Normal cephalic and atramatic  Lungs: Clear bilaterally to auscultation and percussion. Heart: HRRR S1 S2, without MRG.  Pulses are 2+ & equal.            No carotid bruit. No JVD.  No abdominal bruits. No femoral bruits. Abdomen: Bowel sounds are positive, abdomen soft and non-tender without masses or                  Hernia's noted. Significant central obesity. Msk:  Back normal, normal gait. Normal strength and tone for age. Extremities: No clubbing, cyanosis or edema.  DP +1 Neuro: Alert and oriented X 3. Psych:  Good affect, responds appropriately  EKG: NSR with RBBB, Bifascicular block. Rate of 80 bpm.  ASSESSMENT AND PLAN

## 2012-10-23 NOTE — Telephone Encounter (Signed)
Mrs. Trivedi states that he husband is in A-Fib again.

## 2012-10-29 ENCOUNTER — Ambulatory Visit (HOSPITAL_COMMUNITY)
Admission: RE | Admit: 2012-10-29 | Discharge: 2012-10-29 | Disposition: A | Discharge status: Home / Self Care | Payer: PRIVATE HEALTH INSURANCE | Source: Ambulatory Visit | Attending: Adult Health | Admitting: Adult Health

## 2012-10-29 DIAGNOSIS — R0609 Other forms of dyspnea: Secondary | ICD-10-CM

## 2012-10-29 DIAGNOSIS — G4733 Obstructive sleep apnea (adult) (pediatric): Secondary | ICD-10-CM | POA: Insufficient documentation

## 2012-10-29 DIAGNOSIS — I10 Essential (primary) hypertension: Secondary | ICD-10-CM

## 2012-10-29 DIAGNOSIS — I517 Cardiomegaly: Secondary | ICD-10-CM

## 2012-10-29 DIAGNOSIS — R002 Palpitations: Secondary | ICD-10-CM

## 2012-10-29 DIAGNOSIS — I4891 Unspecified atrial fibrillation: Secondary | ICD-10-CM | POA: Insufficient documentation

## 2012-11-07 ENCOUNTER — Emergency Department (HOSPITAL_COMMUNITY)
Admission: EM | Admit: 2012-11-07 | Discharge: 2012-11-07 | Disposition: A | Discharge status: Home / Self Care | Payer: PRIVATE HEALTH INSURANCE | Attending: Emergency Medicine | Admitting: Emergency Medicine

## 2012-11-07 ENCOUNTER — Encounter (HOSPITAL_COMMUNITY): Payer: Self-pay | Admitting: Emergency Medicine

## 2012-11-07 ENCOUNTER — Emergency Department (HOSPITAL_COMMUNITY): Payer: PRIVATE HEALTH INSURANCE

## 2012-11-07 DIAGNOSIS — J45909 Unspecified asthma, uncomplicated: Secondary | ICD-10-CM | POA: Insufficient documentation

## 2012-11-07 DIAGNOSIS — Z8679 Personal history of other diseases of the circulatory system: Secondary | ICD-10-CM | POA: Insufficient documentation

## 2012-11-07 DIAGNOSIS — Z87891 Personal history of nicotine dependence: Secondary | ICD-10-CM | POA: Insufficient documentation

## 2012-11-07 DIAGNOSIS — I452 Bifascicular block: Secondary | ICD-10-CM | POA: Insufficient documentation

## 2012-11-07 DIAGNOSIS — Z8669 Personal history of other diseases of the nervous system and sense organs: Secondary | ICD-10-CM | POA: Insufficient documentation

## 2012-11-07 DIAGNOSIS — R002 Palpitations: Secondary | ICD-10-CM | POA: Insufficient documentation

## 2012-11-07 DIAGNOSIS — IMO0002 Reserved for concepts with insufficient information to code with codable children: Secondary | ICD-10-CM | POA: Insufficient documentation

## 2012-11-07 LAB — COMPREHENSIVE METABOLIC PANEL
ALT: 27 U/L (ref 0–53)
AST: 20 U/L (ref 0–37)
CO2: 25 mEq/L (ref 19–32)
Chloride: 104 mEq/L (ref 96–112)
GFR calc non Af Amer: 79 mL/min — ABNORMAL LOW (ref >90)
Sodium: 141 mEq/L (ref 135–145)
Total Bilirubin: 0.2 mg/dL — ABNORMAL LOW (ref 0.3–1.2)

## 2012-11-07 LAB — CBC WITH DIFFERENTIAL/PLATELET
Basophils Relative: 0 % (ref 0–1)
Eosinophils Absolute: 0.2 10*3/uL (ref 0.0–0.7)
Eosinophils Relative: 2 % (ref 0–5)
HCT: 44.1 % (ref 39.0–52.0)
Hemoglobin: 15.1 g/dL (ref 13.0–17.0)
Lymphs Abs: 2 10*3/uL (ref 0.7–4.0)
MCH: 29.9 pg (ref 26.0–34.0)
MCHC: 34.2 g/dL (ref 30.0–36.0)
MCV: 87.3 fL (ref 78.0–100.0)
Monocytes Absolute: 0.4 10*3/uL (ref 0.1–1.0)
Monocytes Relative: 6 % (ref 3–12)
Neutrophils Relative %: 62 % (ref 43–77)
RBC: 5.05 MIL/uL (ref 4.22–5.81)

## 2012-11-07 MED ORDER — LORAZEPAM 1 MG PO TABS: ORAL_TABLET | ORAL | Status: DC

## 2012-11-07 MED ORDER — LORAZEPAM 2 MG/ML IJ SOLN
0.5000 mg | Freq: Once | INTRAMUSCULAR | Status: AC
  Administered 2012-11-07: 0.5 mg via INTRAVENOUS
  Filled 2012-11-07: qty 1

## 2012-11-07 NOTE — ED Notes (Signed)
Patient states "my heart has been acting up for 4-5 hours today off and on but now it won't stop. It feels like it's beating real fast." Patient has heart monitor on from 2 weeks ago.

## 2012-11-07 NOTE — ED Provider Notes (Signed)
History    This chart was scribed for Benny Lennert, MD by Gerlean Ren, ED Scribe. This patient was seen in room APA05/APA05 and the patient's care was started at 9:05 PM    CSN: [id number]  Arrival date & time 11/07/12  2055   First MD Initiated Contact with Patient 11/07/12 2102      Chief Complaint  Patient presents with  . Tachycardia    The history is provided by the patient. No language interpreter was used.   Elijah Beard is a 52 y.o. male who presents to the Emergency Department complaining of constant, non-improving increased HR palpitations with sudden onset 4-5 hours ago not during exertion that has no associated pain.  Pt does not currently take any heart medications.  Pt is a former smoker and reports occasional alcohol use.   Past Medical History  Diagnosis Date  . Atrial flutter     Status post RFA 10/10 - Dr. Graciela Husbands, repeat ablation for recurrent by Dr Johney Frame 9/12  . Asthma   . Obstructive sleep apnea   . Bifascicular block     chronic (since at least 2010)    Past Surgical History  Procedure Laterality Date  . Tonsillectomy    . Nasal sinus surgery    . Atrial ablation surgery      CTI ablation x 2    Family History  Problem Relation Age of Onset  . Cancer Father     History  Substance Use Topics  . Smoking status: Former Smoker -- 1.00 packs/day for 20 years    Types: Cigarettes    Quit date: 11/17/2003  . Smokeless tobacco: Never Used  . Alcohol Use: No     Comment: On occasion       Review of Systems  Constitutional: Negative for fatigue.  HENT: Negative for congestion, sinus pressure and ear discharge.   Eyes: Negative for discharge.  Respiratory: Negative for cough.   Cardiovascular: Positive for palpitations. Negative for chest pain.  Gastrointestinal: Negative for abdominal pain and diarrhea.  Genitourinary: Negative for frequency and hematuria.  Musculoskeletal: Negative for back pain.  Skin: Negative for rash.  Neurological:  Negative for seizures and headaches.  Psychiatric/Behavioral: Negative for hallucinations.    Allergies  Review of patient's allergies indicates no known allergies.  Home Medications   Current Outpatient Rx  Name  Route  Sig  Dispense  Refill  . fluticasone (FLOVENT HFA) 110 MCG/ACT inhaler   Inhalation   Inhale 1 puff into the lungs 2 (two) times daily.           Marland Kitchen ibuprofen (ADVIL,MOTRIN) 200 MG tablet   Oral   Take 400 mg by mouth every 6 (six) hours as needed. FOR PAIN          . naproxen (NAPROSYN) 500 MG tablet   Oral   Take 1 tablet (500 mg total) by mouth 2 (two) times daily with a meal.   30 tablet   0     BP 139/86  Pulse 98  Temp(Src) 98.4 F (36.9 C) (Oral)  Resp 24  Ht 5\' 11"  (1.803 m)  Wt 347 lb (157.398 kg)  BMI 48.42 kg/m2  SpO2 98%  Physical Exam  Nursing note and vitals reviewed. Constitutional: He is oriented to person, place, and time. He appears well-developed.  HENT:  Head: Normocephalic and atraumatic.  Eyes: Conjunctivae and EOM are normal. No scleral icterus.  Neck: Neck supple. No thyromegaly present.  Cardiovascular: Exam reveals no  gallop and no friction rub.   No murmur heard. Slightly rapid and irregular heartbeat  Pulmonary/Chest: No stridor. He has no wheezes. He has no rales. He exhibits no tenderness.  Abdominal: He exhibits no distension. There is no tenderness. There is no rebound.  Musculoskeletal: Normal range of motion. He exhibits no edema.  Lymphadenopathy:    He has no cervical adenopathy.  Neurological: He is oriented to person, place, and time. Coordination normal.  Skin: No rash noted. No erythema.  Psychiatric: He has a normal mood and affect. His behavior is normal.    ED Course  Procedures (including critical care time) DIAGNOSTIC STUDIES: Oxygen Saturation is 98% on room air, normal by my interpretation.    COORDINATION OF CARE: 9:07 PM- Patient informed of clinical course, understands medical  decision-making process, and agrees with plan.  Results for orders placed during the hospital encounter of 11/07/12  CBC WITH DIFFERENTIAL      Result Value Range   WBC 6.6  4.0 - 10.5 K/uL   RBC 5.05  4.22 - 5.81 MIL/uL   Hemoglobin 15.1  13.0 - 17.0 g/dL   HCT 11.9  14.7 - 82.9 %   MCV 87.3  78.0 - 100.0 fL   MCH 29.9  26.0 - 34.0 pg   MCHC 34.2  30.0 - 36.0 g/dL   RDW 56.2  13.0 - 86.5 %   Platelets 145 (*) 150 - 400 K/uL   Neutrophils Relative 62  43 - 77 %   Neutro Abs 4.0  1.7 - 7.7 K/uL   Lymphocytes Relative 30  12 - 46 %   Lymphs Abs 2.0  0.7 - 4.0 K/uL   Monocytes Relative 6  3 - 12 %   Monocytes Absolute 0.4  0.1 - 1.0 K/uL   Eosinophils Relative 2  0 - 5 %   Eosinophils Absolute 0.2  0.0 - 0.7 K/uL   Basophils Relative 0  0 - 1 %   Basophils Absolute 0.0  0.0 - 0.1 K/uL  COMPREHENSIVE METABOLIC PANEL      Result Value Range   Sodium 141  135 - 145 mEq/L   Potassium 3.5  3.5 - 5.1 mEq/L   Chloride 104  96 - 112 mEq/L   CO2 25  19 - 32 mEq/L   Glucose, Bld 209 (*) 70 - 99 mg/dL   BUN 17  6 - 23 mg/dL   Creatinine, Ser 7.84  0.50 - 1.35 mg/dL   Calcium 9.4  8.4 - 69.6 mg/dL   Total Protein 6.8  6.0 - 8.3 g/dL   Albumin 3.8  3.5 - 5.2 g/dL   AST 20  0 - 37 U/L   ALT 27  0 - 53 U/L   Alkaline Phosphatase 87  39 - 117 U/L   Total Bilirubin 0.2 (*) 0.3 - 1.2 mg/dL   GFR calc non Af Amer 79 (*) >90 mL/min   GFR calc Af Amer >90  >90 mL/min  TROPONIN I      Result Value Range   Troponin I <0.30  <0.30 ng/mL    Dg Chest Portable 1 View  11/07/2012  *RADIOLOGY REPORT*  Clinical Data: Atrial flutter.  Chest pain and shortness of breath.  PORTABLE CHEST - 1 VIEW  Comparison: 07/13/2009.  Findings: Cardiomegaly.  Mild vascular congestion without focal infiltrate or overt CHF.  Negative osseous structures.  Worsening aeration.  IMPRESSION: Cardiomegaly with mild vascular congestion.   Original Report Authenticated By: Davonna Belling, M.D.  No diagnosis  found.    MDM  The chart was scribed for me under my direct supervision.  I personally performed the history, physical, and medical decision making and all procedures in the evaluation of this patient.Benny Lennert, MD 11/19/12 [phone]

## 2012-11-07 NOTE — ED Provider Notes (Signed)
History     CSN: [id number]  Arrival date & time 11/07/12  2055   First MD Initiated Contact with Patient 11/07/12 2102      Chief Complaint  Patient presents with  . Tachycardia    (Consider location/radiation/quality/duration/timing/severity/associated sxs/prior treatment) HPI  Past Medical History  Diagnosis Date  . Atrial flutter     Status post RFA 10/10 - Dr. Graciela Husbands, repeat ablation for recurrent by Dr Johney Frame 9/12  . Asthma   . Obstructive sleep apnea   . Bifascicular block     chronic (since at least 2010)    Past Surgical History  Procedure Laterality Date  . Tonsillectomy    . Nasal sinus surgery    . Atrial ablation surgery      CTI ablation x 2    Family History  Problem Relation Age of Onset  . Cancer Father     History  Substance Use Topics  . Smoking status: Former Smoker -- 1.00 packs/day for 20 years    Types: Cigarettes    Quit date: 11/17/2003  . Smokeless tobacco: Never Used  . Alcohol Use: No     Comment: On occasion       Review of Systems  Allergies  Review of patient's allergies indicates no known allergies.  Home Medications   Current Outpatient Rx  Name  Route  Sig  Dispense  Refill  . fluticasone (FLOVENT HFA) 110 MCG/ACT inhaler   Inhalation   Inhale 1 puff into the lungs 2 (two) times daily.           Marland Kitchen LORazepam (ATIVAN) 1 MG tablet      Take one every 6-8 hours for anxiety of fast heart rate   20 tablet   0     BP 139/86  Pulse 98  Temp(Src) 98.4 F (36.9 C) (Oral)  Resp 24  Ht 5\' 11"  (1.803 m)  Wt 347 lb (157.398 kg)  BMI 48.42 kg/m2  SpO2 98%  Physical Exam  ED Course  Procedures (including critical care time)  Labs Reviewed  CBC WITH DIFFERENTIAL - Abnormal; Notable for the following:    Platelets 145 (*)    All other components within normal limits  COMPREHENSIVE METABOLIC PANEL - Abnormal; Notable for the following:    Glucose, Bld 209 (*)    Total Bilirubin 0.2 (*)    GFR calc non Af  Amer 79 (*)    All other components within normal limits  TROPONIN I   Dg Chest Portable 1 View  11/07/2012  *RADIOLOGY REPORT*  Clinical Data: Atrial flutter.  Chest pain and shortness of breath.  PORTABLE CHEST - 1 VIEW  Comparison: 07/13/2009.  Findings: Cardiomegaly.  Mild vascular congestion without focal infiltrate or overt CHF.  Negative osseous structures.  Worsening aeration.  IMPRESSION: Cardiomegaly with mild vascular congestion.   Original Report Authenticated By: Davonna Belling, M.D.      1. Palpitation     Date: 11/07/2012  Rate:105  Rhythm: sinus tachycardia  occ pac  QRS Axis: left  Intervals: normal  ST/T Wave abnormalities: nonspecific ST changes  Conduction Disutrbances:none  Narrative Interpretation:   Old EKG Reviewed: unchanged     MDM  Pt improved with tx        Benny Lennert, MD 11/07/12 2217

## 2012-11-18 ENCOUNTER — Other Ambulatory Visit: Payer: Self-pay | Admitting: Adult Health

## 2012-11-18 ENCOUNTER — Ambulatory Visit: Payer: PRIVATE HEALTH INSURANCE | Admitting: Adult Health

## 2012-11-18 ENCOUNTER — Other Ambulatory Visit: Payer: Self-pay | Admitting: *Deleted

## 2012-11-18 DIAGNOSIS — R002 Palpitations: Secondary | ICD-10-CM

## 2012-11-18 DIAGNOSIS — I4891 Unspecified atrial fibrillation: Secondary | ICD-10-CM

## 2012-11-18 DIAGNOSIS — I10 Essential (primary) hypertension: Secondary | ICD-10-CM

## 2012-11-18 DIAGNOSIS — R0989 Other specified symptoms and signs involving the circulatory and respiratory systems: Secondary | ICD-10-CM

## 2012-11-20 ENCOUNTER — Ambulatory Visit (INDEPENDENT_AMBULATORY_CARE_PROVIDER_SITE_OTHER): Payer: PRIVATE HEALTH INSURANCE | Admitting: Adult Health

## 2012-11-20 ENCOUNTER — Encounter: Payer: Self-pay | Admitting: Adult Health

## 2012-11-20 VITALS — BP 130/80 | HR 81 | Ht 71.0 in | Wt 347.0 lb

## 2012-11-20 DIAGNOSIS — I4891 Unspecified atrial fibrillation: Secondary | ICD-10-CM

## 2012-11-20 NOTE — Progress Notes (Signed)
Name: Elijah Beard    DOB: Nov 17, 1960  Age: 52 y.o.  MR#: [id number]       PCP:  Fredirick Maudlin, MD      Insurance: Payor: [employer]  Plan: [facility]  Product Type: *No Product type*    CC:    Chief Complaint  Patient presents with  . Tachycardia  . Atrial Fibrillation    VS Filed Vitals:   11/20/12 1300  BP: 130/80  Pulse: 81  Height: 5\' 11"  (1.803 m)  Weight: 347 lb (157.398 kg)  SpO2: 97%    Weights Current Weight  11/20/12 347 lb (157.398 kg)  11/07/12 347 lb (157.398 kg)  10/23/12 347 lb 4 oz (157.512 kg)    Blood Pressure  BP Readings from Last 3 Encounters:  11/20/12 130/80  11/07/12 122/75  10/23/12 120/75     Admit date:  (Not on file) Last encounter with RMR:  11/18/2012   Allergy Review of patient's allergies indicates no known allergies.  Current Outpatient Prescriptions  Medication Sig Dispense Refill  . fluticasone (FLOVENT HFA) 110 MCG/ACT inhaler Inhale 1 puff into the lungs 2 (two) times daily.        Marland Kitchen LORazepam (ATIVAN) 1 MG tablet Take one every 6-8 hours for anxiety of fast heart rate  20 tablet  0   No current facility-administered medications for this visit.    Discontinued Meds:   There are no discontinued medications.  Patient Active Problem List  Diagnosis  . MORBID OBESITY  . OBSTRUCTIVE SLEEP APNEA  . ATRIAL FIBRILLATION  . ATRIAL FLUTTER  . ASTHMA  . PERS HX NONCOMPLIANCE W/MED TX PRS HAZARDS HLTH  . Encounter for long-term (current) use of anticoagulants  . Tachycardia    LABS    Component Value Date/Time   NA 141 11/07/2012 2116   NA 139 07/04/2012 0037   NA 140 09/21/2011 1837   K 3.5 11/07/2012 2116   K 3.8 07/04/2012 0037   K 3.6 09/21/2011 1837   CL 104 11/07/2012 2116   CL 104 07/04/2012 0037   CL 104 09/21/2011 1837   CO2 25 11/07/2012 2116   CO2 28 07/04/2012 0037   CO2 26 09/21/2011 1837   GLUCOSE 209* 11/07/2012 2116   GLUCOSE 115* 07/04/2012 0037   GLUCOSE 112* 09/21/2011 1837   BUN 17 11/07/2012 2116   BUN 18  07/04/2012 0037   BUN 16 09/21/2011 1837   CREATININE 1.08 11/07/2012 2116   CREATININE 0.98 07/04/2012 0037   CREATININE 0.93 09/21/2011 1837   CALCIUM 9.4 11/07/2012 2116   CALCIUM 9.7 07/04/2012 0037   CALCIUM 8.7 09/21/2011 1837   GFRNONAA 79* 11/07/2012 2116   GFRNONAA >90 07/04/2012 0037   GFRNONAA >90 09/21/2011 1837   GFRAA >90 11/07/2012 2116   GFRAA >90 07/04/2012 0037   GFRAA >90 09/21/2011 1837   CMP     Component Value Date/Time   NA 141 11/07/2012 2116   K 3.5 11/07/2012 2116   CL 104 11/07/2012 2116   CO2 25 11/07/2012 2116   GLUCOSE 209* 11/07/2012 2116   BUN 17 11/07/2012 2116   CREATININE 1.08 11/07/2012 2116   CALCIUM 9.4 11/07/2012 2116   PROT 6.8 11/07/2012 2116   ALBUMIN 3.8 11/07/2012 2116   AST 20 11/07/2012 2116   ALT 27 11/07/2012 2116   ALKPHOS 87 11/07/2012 2116   BILITOT 0.2* 11/07/2012 2116   GFRNONAA 79* 11/07/2012 2116   GFRAA >90 11/07/2012 2116       Component Value  Date/Time   WBC 6.6 11/07/2012 2116   WBC 7.4 07/04/2012 0037   WBC 3.8* 09/21/2011 1837   HGB 15.1 11/07/2012 2116   HGB 15.5 07/04/2012 0037   HGB 15.8 09/21/2011 1837   HCT 44.1 11/07/2012 2116   HCT 44.8 07/04/2012 0037   HCT 46.6 09/21/2011 1837   MCV 87.3 11/07/2012 2116   MCV 87.7 07/04/2012 0037   MCV 88.1 09/21/2011 1837    Lipid Panel     Component Value Date/Time   CHOL  Value: 168        ATP III CLASSIFICATION:  <200     mg/dL   Desirable  098-119  mg/dL   Borderline High  >=147    mg/dL   High        82/95/6213 0425   TRIG 120 07/14/2009 0425   HDL 31* 07/14/2009 0425   CHOLHDL 5.4 07/14/2009 0425   VLDL 24 07/14/2009 0425   LDLCALC  Value: 113        Total Cholesterol/HDL:CHD Risk Coronary Heart Disease Risk Table                     Men   Women  1/2 Average Risk   3.4   3.3  Average Risk       5.0   4.4  2 X Average Risk   9.6   7.1  3 X Average Risk  23.4   11.0        Use the calculated Patient Ratio above and the CHD Risk Table to determine the patient's CHD Risk.        ATP III  CLASSIFICATION (LDL):  <100     mg/dL   Optimal  086-578  mg/dL   Near or Above                    Optimal  130-159  mg/dL   Borderline  469-629  mg/dL   High  >528     mg/dL   Very High* 41/32/4401 0425    ABG No results found for this basename: phart, pco2, pco2art, po2, po2art, hco3, tco2, acidbasedef, o2sat     Lab Results  Component Value Date   TSH 0.308* 09/21/2011   BNP (last 3 results) No results found for this basename: PROBNP,  in the last 8760 hours Cardiac Panel (last 3 results) No results found for this basename: CKTOTAL, CKMB, TROPONINI, RELINDX,  in the last 72 hours  Iron/TIBC/Ferritin No results found for this basename: iron, tibc, ferritin     EKG Orders placed in visit on 11/18/12  . CARDIAC EVENT MONITOR     Prior Assessment and Plan Problem List as of 11/20/2012     ICD-9-CM     Cardiology Problems   ATRIAL FIBRILLATION   Last Assessment & Plan   10/23/2012 Office Visit Written 10/23/2012  4:10 PM by Jodelle Gross, NP     Currently in NSR but has had self reported episodic rapid HR yesterday with longest lasting approximatley 10 hours. He states he has had episodes 1-4 times a month, but never lasting as long as it did last evening. I will place 21 day cardiac monitor on the patient to evaluate for recurrence of atrial tachycardia, SVT or tachycardia. He will also have an echocardiogram completed. Last echo was in 2012 with EF of 55%-60%, no WMA or LVH was seen at that time. He will return in 1 month, but has been advised to  go to ER for recurrent elevation in HR that lasts longer than an hours.   He admits to drinking caffeine during the day, 2 16 oz Pepsi's and several iced tea drinks. I have asked him to cut down on his caffeine consumption.    ATRIAL FLUTTER   Last Assessment & Plan   07/24/2011 Office Visit Written 07/24/2011  1:42 PM by Hillis Range, MD     Doing well s/p ablation He has already stopped coumadin  No further workup planned  Return as  needed  Compliance with weight loss and CPAP were encouraged      Other   MORBID OBESITY   Last Assessment & Plan   10/23/2012 Office Visit Written 10/23/2012  4:11 PM by Jodelle Gross, NP     Weight loss is recommended for better sleep hygiene and overall health improvement.    OBSTRUCTIVE SLEEP APNEA   Last Assessment & Plan   10/23/2012 Office Visit Written 10/23/2012  4:11 PM by Jodelle Gross, NP     He continues to use CPAP at HS. Has not been sleeping well.     ASTHMA   PERS HX NONCOMPLIANCE W/MED TX PRS HAZARDS HLTH   Encounter for long-term (current) use of anticoagulants   Last Assessment & Plan   03/23/2011 Office Visit Written 03/23/2011 10:49 AM by Wendall Stade, MD     INR Rx last two weeks.  Continue to check weekly until [facility].  Mild bleeding in gums but nothing else    Tachycardia   Last Assessment & Plan   10/18/2011 Office Visit Written 10/18/2011 11:18 AM by Hillis Range, MD     Resolved Given bifascicular block, I would recommend that we avoid AV nodal blocking agents        Imaging: Dg Chest Portable 1 View  11/07/2012  *RADIOLOGY REPORT*  Clinical Data: Atrial flutter.  Chest pain and shortness of breath.  PORTABLE CHEST - 1 VIEW  Comparison: 07/13/2009.  Findings: Cardiomegaly.  Mild vascular congestion without focal infiltrate or overt CHF.  Negative osseous structures.  Worsening aeration.  IMPRESSION: Cardiomegaly with mild vascular congestion.   Original Report Authenticated By: Davonna Belling, M.D.

## 2012-11-20 NOTE — Assessment & Plan Note (Signed)
I have reviewed cardiac montior results with the patient and given him a copy of the report. I have discussed this with Dr. Diona Browner and showed him the rhythm sheets. We are limited on medication treatment as he is not a candidate for AV blocking agents per Dr. Jenel Lucks last note. We will send him to see Dr. Johney Frame again for discussion of need for repeat EP study vs other medical options,. The patient is in agreement to do this and will see Dr. Johney Frame in [place] or [place] depending on the schedule,

## 2012-11-20 NOTE — Patient Instructions (Addendum)
Your physician recommends that you schedule a follow-up appointment in: 3 months  We will refer you to Dr Tillie Rung for consultation regarding your fast heart rate.

## 2012-11-20 NOTE — Progress Notes (Signed)
   HPI: [name] is a 52 y/o patient of Dr. Diona Browner and Allred we are following for ongoing assessment and management of tachy atrial arrhythmia s/p RFA on 10/10 with repeat albation by Dr.Allred 9/12. He was last seen as an add on to the office on 10/23/2012, due to recurrent rapid heart rate that occurred on and off.  He has two episodes while walking around in his house lasting approximately 10 minutes each, and resolving on its own.            A cardiac monitor was placed on last visit This demonstrated a 15 beat run of SVT @ 145 bpm, otherwise, NR, ST, IVCD and a few PAC's. He did not report symptoms. Echocardiogram was also completed with EF of 55%-60%, grade II diastolic dysfunction and mild LVH.He is not on any rate reducing medications, and per Dr. Jenel Lucks note, he is not to be on AV blocking agents in the setting of bifascicular block     The patient does not feel the highest HR recorded. He is anxious today as he is having insurance coverage issues. He is willing to see Dr. Johney Frame on follow up. He is wishes to go to [place].    No Known Allergies  Current Outpatient Prescriptions  Medication Sig Dispense Refill  . fluticasone (FLOVENT HFA) 110 MCG/ACT inhaler Inhale 1 puff into the lungs 2 (two) times daily.        Marland Kitchen LORazepam (ATIVAN) 1 MG tablet Take one every 6-8 hours for anxiety of fast heart rate  20 tablet  0   No current facility-administered medications for this visit.    Past Medical History  Diagnosis Date  . Atrial flutter     Status post RFA 10/10 - Dr. Graciela Husbands, repeat ablation for recurrent by Dr Johney Frame 9/12  . Asthma   . Obstructive sleep apnea   . Bifascicular block     chronic (since at least 2010)    Past Surgical History  Procedure Laterality Date  . Tonsillectomy    . Nasal sinus surgery    . Atrial ablation surgery      CTI ablation x 2    ZOX:[id number] of systems complete and found to be negative unless listed above  PHYSICAL EXAM There were no  vitals taken for this visit.  General: Well developed, well nourished, in no acute distress Head: Eyes PERRLA, No xanthomas.   Normal cephalic and atramatic  Lungs: Clear bilaterally to auscultation and percussion. Heart: HRRR S1 S2, without MRG.  Pulses are 2+ & equal.            No carotid bruit. No JVD.  No abdominal bruits. No femoral bruits. Abdomen: Bowel sounds are positive, abdomen soft and non-tender without masses or                  Hernia's noted. Msk:  Back normal, normal gait. Normal strength and tone for age. Extremities: No clubbing, cyanosis or edema.  DP +1 Neuro: Alert and oriented X 3. Psych:  Good affect, responds appropriately    ASSESSMENT AND PLAN

## 2012-12-12 ENCOUNTER — Institutional Professional Consult (permissible substitution): Payer: PRIVATE HEALTH INSURANCE | Admitting: Internal Medicine

## 2012-12-27 ENCOUNTER — Institutional Professional Consult (permissible substitution): Payer: PRIVATE HEALTH INSURANCE | Admitting: Internal Medicine

## 2012-12-30 ENCOUNTER — Encounter: Payer: Self-pay | Admitting: Internal Medicine

## 2012-12-30 ENCOUNTER — Ambulatory Visit (INDEPENDENT_AMBULATORY_CARE_PROVIDER_SITE_OTHER): Payer: PRIVATE HEALTH INSURANCE | Admitting: Internal Medicine

## 2012-12-30 VITALS — BP 167/90 | HR 87 | Ht 72.0 in | Wt 345.0 lb

## 2012-12-30 DIAGNOSIS — G4733 Obstructive sleep apnea (adult) (pediatric): Secondary | ICD-10-CM

## 2012-12-30 DIAGNOSIS — I4891 Unspecified atrial fibrillation: Secondary | ICD-10-CM

## 2012-12-30 NOTE — Patient Instructions (Addendum)
Continue all current medications. Follow up in  3 months - [place]

## 2013-01-04 NOTE — Assessment & Plan Note (Signed)
Weight loss is strongly advised 

## 2013-01-04 NOTE — Assessment & Plan Note (Signed)
complaince encouraged

## 2013-01-04 NOTE — Assessment & Plan Note (Signed)
Palpitations are likely due to afib He would be a poor candidate for further ablation at this point Continue medical therapy

## 2013-01-04 NOTE — Progress Notes (Signed)
    PCP:  Fredirick Maudlin, MD  The patient presents today for routine electrophysiology followup.  Since last being seen in our clinic, the patient reports doing reasonably well.  He has had several episodes of tachypalpitations.  These have improved recently.  Today, he denies symptoms of  chest pain, shortness of breath, orthopnea, PND, lower extremity edema, dizziness, presyncope, syncope, or neurologic sequela.  The patient feels that he is tolerating medications without difficulties and is otherwise without complaint today.   Past Medical History  Diagnosis Date  . Atrial flutter     Status post RFA 10/10 - Dr. Graciela Husbands, repeat ablation for recurrent by Dr Johney Frame 9/12  . Asthma   . Obstructive sleep apnea   . Bifascicular block     chronic (since at least 2010)   Past Surgical History  Procedure Laterality Date  . Tonsillectomy    . Nasal sinus surgery    . Atrial ablation surgery      CTI ablation x 2    Current Outpatient Prescriptions  Medication Sig Dispense Refill  . albuterol (PROVENTIL HFA;VENTOLIN HFA) 108 (90 BASE) MCG/ACT inhaler Inhale 2 puffs into the lungs every 6 (six) hours as needed for wheezing.      . budesonide-formoterol (SYMBICORT) 160-4.5 MCG/ACT inhaler Inhale 2 puffs into the lungs 2 (two) times daily.       No current facility-administered medications for this visit.    No Known Allergies  History   Social History  . Marital Status: Married    Spouse Name: N/A    Number of Children: N/A  . Years of Education: N/A   Occupational History  . Not on file.   Social History Main Topics  . Smoking status: Former Smoker -- 1.00 packs/day for 20 years    Types: Cigarettes    Quit date: 11/17/2003  . Smokeless tobacco: Never Used  . Alcohol Use: No     Comment: On occasion   . Drug Use: No  . Sexually Active: Not on file   Other Topics Concern  . Not on file   Social History Narrative  . No narrative on file    Family History  Problem  Relation Age of Onset  . Cancer Father     ROS-  All systems are reviewed and are negative except as outlined in the HPI above  Physical Exam: Filed Vitals:   12/30/12 1519  BP: 167/90  Pulse: 87  Height: 6' (1.829 m)  Weight: 345 lb (156.491 kg)    GEN- The patient is overweight appearing, alert and oriented x 3 today.   Head- normocephalic, atraumatic Eyes-  Sclera clear, conjunctiva pink Ears- hearing intact Oropharynx- clear Neck- supple, no JVP Lymph- no cervical lymphadenopathy Lungs- Clear to ausculation bilaterally, normal work of breathing Heart- Regular rate and rhythm, no murmurs, rubs or gallops, PMI not laterally displaced GI- soft, NT, ND, + BS Extremities- no clubbing, cyanosis, or edema MS- no significant deformity or atrophy Skin- no rash or lesion Psych- euthymic mood, full affect Neuro- strength and sensation are intact  Assessment and Plan:

## 2013-05-01 ENCOUNTER — Encounter: Payer: Self-pay | Admitting: Internal Medicine

## 2013-05-01 ENCOUNTER — Ambulatory Visit (INDEPENDENT_AMBULATORY_CARE_PROVIDER_SITE_OTHER): Payer: PRIVATE HEALTH INSURANCE | Admitting: Internal Medicine

## 2013-05-01 VITALS — BP 132/83 | HR 77 | Ht 71.0 in | Wt 351.8 lb

## 2013-05-01 DIAGNOSIS — I4892 Unspecified atrial flutter: Secondary | ICD-10-CM

## 2013-05-01 DIAGNOSIS — I4891 Unspecified atrial fibrillation: Secondary | ICD-10-CM

## 2013-05-01 DIAGNOSIS — I872 Venous insufficiency (chronic) (peripheral): Secondary | ICD-10-CM

## 2013-05-01 DIAGNOSIS — G4733 Obstructive sleep apnea (adult) (pediatric): Secondary | ICD-10-CM

## 2013-05-01 NOTE — Progress Notes (Signed)
PCP:  Fredirick Maudlin, MD  The patient presents today for routine electrophysiology followup.  Since last being seen in our clinic, the patient reports doing reasonably well.   He has rare palpitations.  Prior event monitor documented PACs and nonsustained atach as the cause.  This has not changed.  He has chronic difficulty with venous insufficiency of the R leg.   Today, he denies symptoms of  chest pain, shortness of breath, orthopnea, PND,  dizziness, presyncope, syncope, or neurologic sequela.   He has gained 6 lbs since last visit.  He is compliant now with CPAP.  The patient feels that he is tolerating medications without difficulties and is otherwise without complaint today.   Past Medical History  Diagnosis Date  . Atrial flutter     Status post RFA 10/10 - Dr. Graciela Husbands, repeat ablation for recurrent by Dr Johney Frame 9/12  . Asthma   . Obstructive sleep apnea   . Bifascicular block     chronic (since at least 2010)   Past Surgical History  Procedure Laterality Date  . Tonsillectomy    . Nasal sinus surgery    . Atrial ablation surgery      CTI ablation x 2    Current Outpatient Prescriptions  Medication Sig Dispense Refill  . albuterol (PROVENTIL HFA;VENTOLIN HFA) 108 (90 BASE) MCG/ACT inhaler Inhale 2 puffs into the lungs every 6 (six) hours as needed for wheezing.      . budesonide-formoterol (SYMBICORT) 160-4.5 MCG/ACT inhaler Inhale 2 puffs into the lungs 2 (two) times daily.      . cephALEXin (KEFLEX) 500 MG capsule Take 500 mg by mouth 4 (four) times daily.      . naproxen (NAPROSYN) 500 MG tablet Take 500 mg by mouth 2 (two) times daily with a meal.      . sulfamethoxazole-trimethoprim (BACTRIM DS,SEPTRA DS) 800-160 MG per tablet Take 1 tablet by mouth 2 (two) times daily.       No current facility-administered medications for this visit.    No Known Allergies  History   Social History  . Marital Status: Married    Spouse Name: N/A    Number of Children: N/A  .  Years of Education: N/A   Occupational History  . Not on file.   Social History Main Topics  . Smoking status: Former Smoker -- 1.00 packs/day for 20 years    Types: Cigarettes    Quit date: 11/17/2003  . Smokeless tobacco: Never Used  . Alcohol Use: No     Comment: On occasion   . Drug Use: No  . Sexual Activity: Not on file   Other Topics Concern  . Not on file   Social History Narrative  . No narrative on file    Family History  Problem Relation Age of Onset  . Cancer Father     Physical Exam: Filed Vitals:   05/01/13 1334  BP: 132/83  Pulse: 77  Height: 5\' 11"  (1.803 m)  Weight: 351 lb 12.8 oz (159.575 kg)    GEN- The patient is overweight appearing, alert and oriented x 3 today.   Head- normocephalic, atraumatic Eyes-  Sclera clear, conjunctiva pink Ears- hearing intact Oropharynx- clear Neck- supple, no JVP Lymph- no cervical lymphadenopathy Lungs- Clear to ausculation bilaterally, normal work of breathing Heart- Regular rate and rhythm, no murmurs, rubs or gallops, PMI not laterally displaced GI- soft, NT, ND, + BS Extremities- no clubbing, cyanosis, or edema, + moderate varicosity of the R leg, no  homans/cords Neuro- strength and sensation are intact  ekg today reveals sinus rhythm, RBBB/LAHB  Assessment and Plan:  1. Pacs/ nonsustained atach Stable No change required today  2. varicose veins Support hose prescribed He declines referral to vein specialist  3. Obesity Weight loss again advised  4. OSA Compliance with CPAP encouraged  5. Atrial flutter Resolved s/p ablation  Return in 6 months

## 2013-05-01 NOTE — Patient Instructions (Addendum)
   Support stockings - script given Continue all current medications. Allred - 6 months

## 2013-11-27 IMAGING — CR DG CHEST 1V PORT
1 series · 1 of 1 positions shown · non-contrast
Comparison: 07/13/2009.

CLINICAL DATA: Atrial flutter.  Chest pain and shortness of breath.

PORTABLE CHEST - 1 VIEW

[view not recorded]
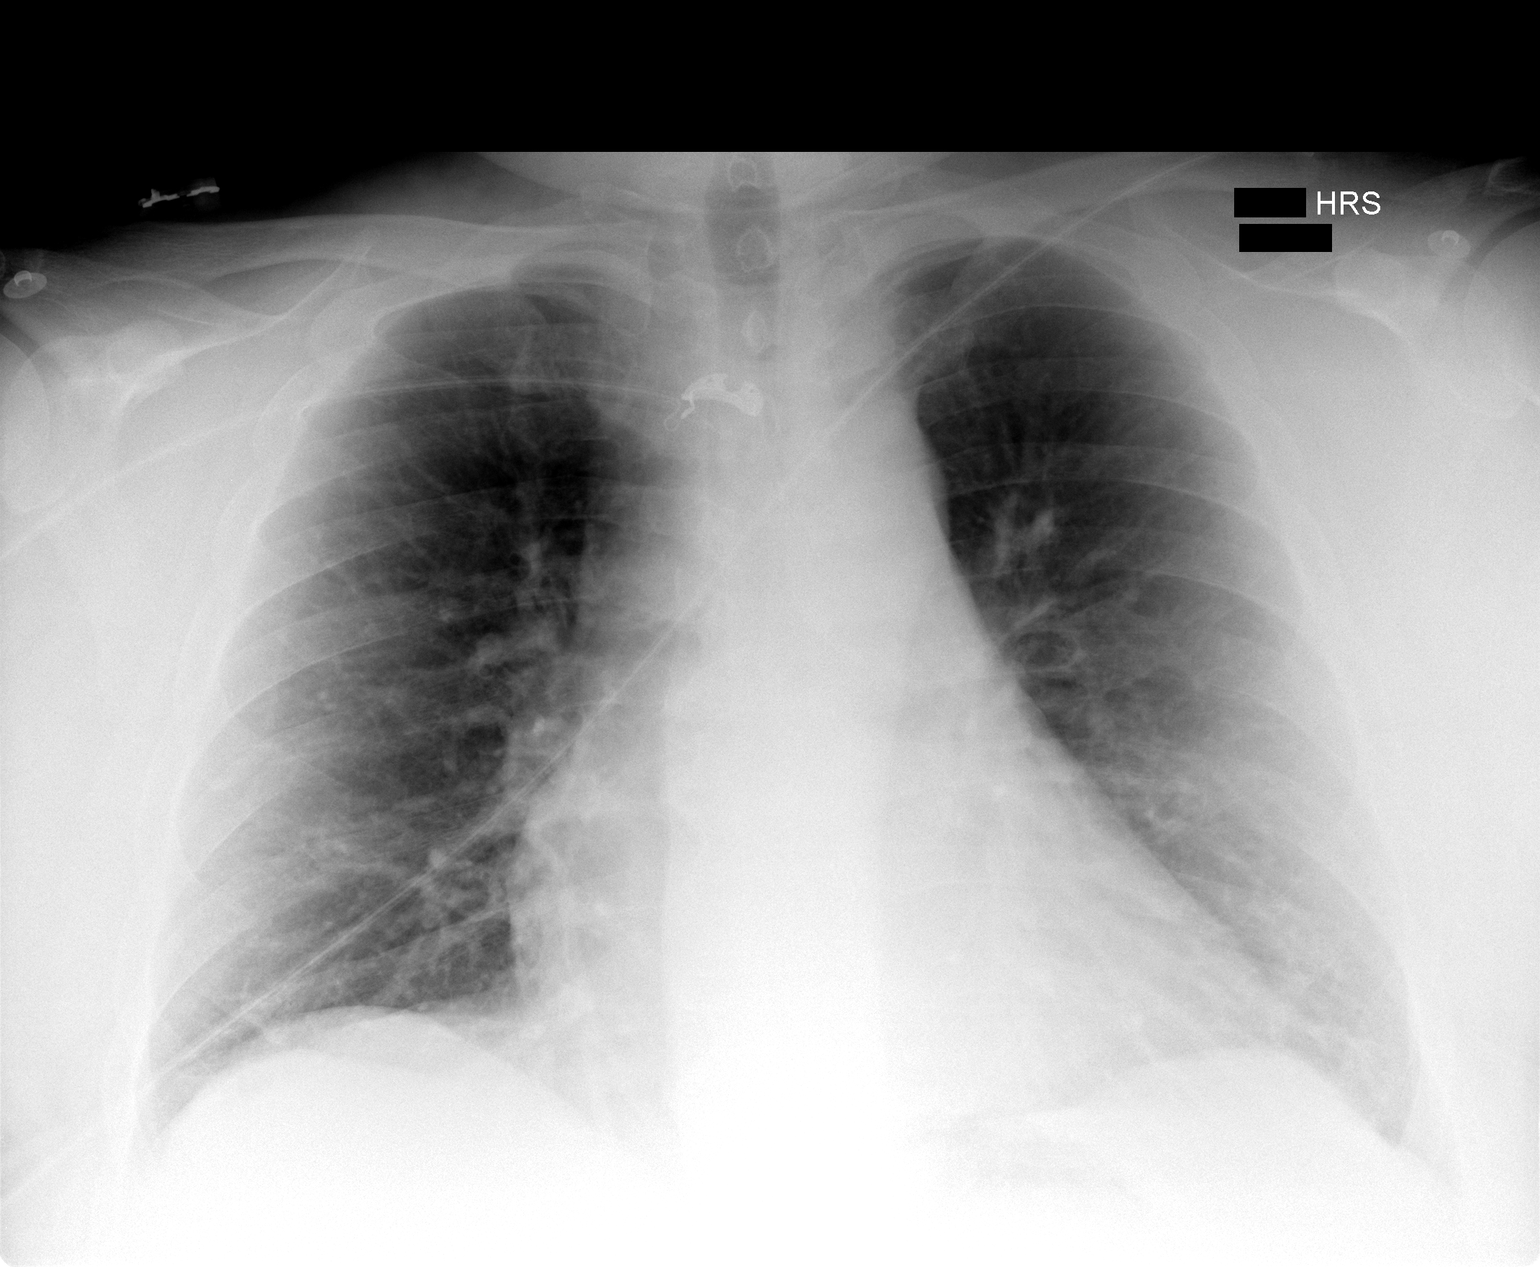

[1 of 1 positions shown; findings below may reference images not displayed]

FINDINGS: Cardiomegaly.  Mild vascular congestion without focal
infiltrate or overt CHF.  Negative osseous structures.  Worsening
aeration.
IMPRESSION: Cardiomegaly with mild vascular congestion.

## 2013-12-10 ENCOUNTER — Encounter: Payer: Self-pay | Admitting: Internal Medicine

## 2013-12-10 ENCOUNTER — Ambulatory Visit (INDEPENDENT_AMBULATORY_CARE_PROVIDER_SITE_OTHER): Payer: PRIVATE HEALTH INSURANCE | Admitting: Internal Medicine

## 2013-12-10 VITALS — BP 124/77 | HR 79 | Ht 72.0 in | Wt 351.0 lb

## 2013-12-10 DIAGNOSIS — G4733 Obstructive sleep apnea (adult) (pediatric): Secondary | ICD-10-CM

## 2013-12-10 DIAGNOSIS — I4891 Unspecified atrial fibrillation: Secondary | ICD-10-CM

## 2013-12-10 MED ORDER — DILTIAZEM HCL 30 MG PO TABS: 30.0000 mg | ORAL_TABLET | Freq: Four times a day (QID) | ORAL | Status: DC | PRN

## 2013-12-10 NOTE — Patient Instructions (Signed)
   Cardizem 30mg  - may take 1-2 tabs every 6 hours as needed  - new sent to pharm Continue all other medications.   Your physician wants you to follow up in: 6 months.  You will receive a reminder letter in the mail one-two months in advance.  If you don't receive a letter, please call our office to schedule the follow up appointment - Dr. Johney FrameAllred

## 2013-12-10 NOTE — Progress Notes (Signed)
PCP:  Fredirick Maudlin, MD  The patient presents today for routine electrophysiology followup.  Since last being seen in our clinic, the patient reports doing reasonably well.  He has symptoms of afib every 2 weeks.  Episodes are mostly short lived but can last up to 2 days at a time.  Prior event monitor documented PACs and nonsustained atach as the cause.  I suspect afib also.  He reports vagal triggers such as drinking a cold beverage or overeating.  He does not drink much alcohol.  He has chronic difficulty with venous insufficiency of the R leg.  He has made no improvement with weight loss. Today, he denies symptoms of  chest pain, shortness of breath, orthopnea, PND,  dizziness, presyncope, syncope, or neurologic sequela.   He is compliant now with CPAP.  The patient feels that he is tolerating medications without difficulties and is otherwise without complaint today.   Past Medical History  Diagnosis Date  . Atrial flutter     Status post RFA 10/10 - Dr. Graciela Husbands, repeat ablation for recurrent by Dr Johney Frame 9/12  . Asthma   . Obstructive sleep apnea   . Bifascicular block     chronic (since at least 2010)   Past Surgical History  Procedure Laterality Date  . Tonsillectomy    . Nasal sinus surgery    . Atrial ablation surgery      CTI ablation x 2    Current Outpatient Prescriptions  Medication Sig Dispense Refill  . albuterol (PROVENTIL HFA;VENTOLIN HFA) 108 (90 BASE) MCG/ACT inhaler Inhale 2 puffs into the lungs every 6 (six) hours as needed for wheezing.      . budesonide-formoterol (SYMBICORT) 160-4.5 MCG/ACT inhaler Inhale 2 puffs into the lungs 2 (two) times daily.      Marland Kitchen diltiazem (CARDIZEM) 30 MG tablet Take 1-2 tablets (30-60 mg total) by mouth 4 (four) times daily as needed.  30 tablet  2   No current facility-administered medications for this visit.    No Known Allergies  History   Social History  . Marital Status: Married    Spouse Name: N/A    Number of Children:  N/A  . Years of Education: N/A   Occupational History  . Not on file.   Social History Main Topics  . Smoking status: Former Smoker -- 1.00 packs/day for 20 years    Types: Cigarettes    Quit date: 11/17/2003  . Smokeless tobacco: Never Used  . Alcohol Use: No     Comment: On occasion   . Drug Use: No  . Sexual Activity: Not on file   Other Topics Concern  . Not on file   Social History Narrative  . No narrative on file    Family History  Problem Relation Age of Onset  . Cancer Father     Physical Exam: Filed Vitals:   12/10/13 1559  BP: 124/77  Pulse: 79  Height: 6' (1.829 m)  Weight: 351 lb (159.213 kg)  SpO2: 98%    GEN- The patient is overweight appearing, alert and oriented x 3 today.   Head- normocephalic, atraumatic Eyes-  Sclera clear, conjunctiva pink Ears- hearing intact Oropharynx- clear Neck- supple, no JVP Lymph- no cervical lymphadenopathy Lungs- Clear to ausculation bilaterally, normal work of breathing Heart- Regular rate and rhythm, no murmurs, rubs or gallops, PMI not laterally displaced GI- soft, NT, ND, + BS Extremities- no clubbing, cyanosis, or edema, + moderate varicosity of the R leg, no homans/cords Neuro- strength  and sensation are intact  ekg today reveals sinus rhythm, RBBB/LAHB  Assessment and Plan:  1. Pacs/ nonsustained atach, is suspect afib also.  He has had prior atrial flutter ablation Will give prn cardizem for tachypalpitations.  Given bifascicular block, I am reluctant to prescribe daily medicines which could affect the AV node.  His options are limited.  I was very frank with the patient today.  I think that his morbid obesity (weight of > 350 lbs) increases his risks of atrial arrhythmias long term.  I will not be able to adequately control his arrhythmias unless he loses weight.  2. Obesity Weight loss again advised  3. OSA Compliance with CPAP encouraged  Return in 6 months He will contact my office if problems  arise in the interim

## 2013-12-29 ENCOUNTER — Ambulatory Visit: Payer: PRIVATE HEALTH INSURANCE | Admitting: Internal Medicine

## 2014-03-05 ENCOUNTER — Other Ambulatory Visit: Payer: Self-pay | Admitting: *Deleted

## 2014-03-05 DIAGNOSIS — I83893 Varicose veins of bilateral lower extremities with other complications: Secondary | ICD-10-CM

## 2014-04-17 ENCOUNTER — Encounter: Payer: Self-pay | Admitting: Surgery

## 2014-04-20 ENCOUNTER — Ambulatory Visit (HOSPITAL_COMMUNITY)
Admission: RE | Admit: 2014-04-20 | Discharge: 2014-04-20 | Disposition: A | Discharge status: Home / Self Care | Payer: PRIVATE HEALTH INSURANCE | Source: Ambulatory Visit | Attending: Surgery | Admitting: Surgery

## 2014-04-20 ENCOUNTER — Ambulatory Visit (INDEPENDENT_AMBULATORY_CARE_PROVIDER_SITE_OTHER): Payer: PRIVATE HEALTH INSURANCE | Admitting: Surgery

## 2014-04-20 ENCOUNTER — Encounter: Payer: Self-pay | Admitting: Surgery

## 2014-04-20 VITALS — BP 122/81 | HR 63 | Ht 72.0 in | Wt 341.0 lb

## 2014-04-20 DIAGNOSIS — I83893 Varicose veins of bilateral lower extremities with other complications: Secondary | ICD-10-CM | POA: Insufficient documentation

## 2014-04-20 NOTE — Progress Notes (Signed)
Patient name: Elijah Beard MRN: [id number] DOB: 16-Apr-1961 Sex: male   Referred by: Dr. Juanetta Gosling  Reason for referral:  Chief Complaint  Patient presents with  . Varicose Veins    c/o left LE vv's    HISTORY OF PRESENT ILLNESS: This is a 53 year old gentleman who comes in today for evaluation of left leg veins.  He states that he has had issues with varicose veins for many years, however they have gotten worse recently.  He states that he gets areas that the, red.  He has been treated for thrombophlebitis twice this year with antibiotics.  He states that his symptoms are worse when he goes on long travel trips.  He complains of swelling in both legs, the left is worse than the right.  He also has a fair amount of pain at his varicose vein sites.  His symptoms are improved in the morning, however as he goes about his stay the swelling and pain becomes more severe.  He has never had any bleeding episodes.  There is no history of DVT.  There is no family history of DVT.  The patient suffers from atrial fibrillation.  He is not on anticoagulations other than an occasional aspirin.  He hasn't undergone ablation x2 with Dr. Johney Frame.  He is on inhalers for asthma.  He is a nonsmoker.  He quit 10 years ago.  Past Medical History  Diagnosis Date  . Atrial flutter     Status post RFA 10/10 - Dr. Graciela Husbands, repeat ablation for recurrent by Dr Johney Frame 9/12  . Asthma   . Obstructive sleep apnea   . Bifascicular block     chronic (since at least 2010)    Past Surgical History  Procedure Laterality Date  . Tonsillectomy    . Nasal sinus surgery    . Atrial ablation surgery      CTI ablation x 2    History   Social History  . Marital Status: Married    Spouse Name: N/A    Number of Children: N/A  . Years of Education: N/A   Occupational History  . Not on file.   Social History Main Topics  . Smoking status: Former Smoker -- 1.00 packs/day for 20 years    Types: Cigarettes    Quit  date: 11/17/2003  . Smokeless tobacco: Never Used  . Alcohol Use: No     Comment: On occasion   . Drug Use: No  . Sexual Activity: Not on file   Other Topics Concern  . Not on file   Social History Narrative  . No narrative on file    Family History  Problem Relation Age of Onset  . Cancer Father   . Cancer Mother     Allergies as of 04/20/2014  . (No Known Allergies)    Current Outpatient Prescriptions on File Prior to Visit  Medication Sig Dispense Refill  . albuterol (PROVENTIL HFA;VENTOLIN HFA) 108 (90 BASE) MCG/ACT inhaler Inhale 2 puffs into the lungs every 6 (six) hours as needed for wheezing.      . budesonide-formoterol (SYMBICORT) 160-4.5 MCG/ACT inhaler Inhale 2 puffs into the lungs 2 (two) times daily.      Marland Kitchen diltiazem (CARDIZEM) 30 MG tablet Take 1-2 tablets (30-60 mg total) by mouth 4 (four) times daily as needed.  30 tablet  2   No current facility-administered medications on file prior to visit.     REVIEW OF SYSTEMS: Cardiovascular: No chest pain, chest  pressure, palpitations, orthopnea, or dyspnea on exertion. No claudication or rest pain,  No history of DVT please see history of present illness. Pulmonary: Positive for asthma Neurologic: No weakness, paresthesias, aphasia, or amaurosis. No dizziness. Hematologic: No bleeding problems or clotting disorders. Musculoskeletal: No joint pain or joint swelling. Gastrointestinal: No blood in stool or hematemesis Genitourinary: No dysuria or hematuria. Psychiatric:: No history of major depression. Integumentary: No rashes or ulcers. Constitutional: No fever or chills.  PHYSICAL EXAMINATION: General: The patient appears their stated age.  Vital signs are BP 122/81  Pulse 63  Ht 6' (1.829 m)  Wt 341 lb (154.677 kg)  BMI 46.24 kg/m2  SpO2 100% HEENT:  No gross abnormalities Pulmonary: Respirations are non-labored Abdomen: Soft and non-tender , obese Musculoskeletal: There are no major deformities.     Neurologic: No focal weakness or paresthesias are detected, Skin: There are no ulcer or rashes noted. Psychiatric: The patient has normal affect. Cardiovascular: There is a regular rate and rhythm without significant murmur appreciated.  1+ bilateral edema.  Multiple reticular veins bilaterally.  Prominent varicosities within the left medial and posterior calf.  Diagnostic Studies: Vascular studies have been ordered and reviewed of the left leg.  This shows superficial and deep vein reflux.  The deep vein reflux is only in the common femoral vein.  Maximum diameter of the saphenous vein at the saphenofemoral junction is 1.7 cm.    Assessment:  Venous insufficiency, left leg Plan: The patient suffers from symptomatic venous insufficiency with swelling and pain from his varicosities.  I have recommended a 3 month trial of 20-30 mm compression stockings which are thigh-high in length.  He was given a prescription to get these today.  He will come back to see Dr. Hart RochesterLawson in 3 months.  I feel he is a good candidate for laser ablation of the left great saphenous vein and stab phlebectomy of his varicosities.     Jorge NyV. Wells Afomia Blackley IV, M.D. [facility] Office: [phone] Pager:  [phone]

## 2014-07-17 ENCOUNTER — Encounter: Payer: Self-pay | Admitting: Vascular Surgery

## 2014-07-20 ENCOUNTER — Ambulatory Visit: Payer: PRIVATE HEALTH INSURANCE | Admitting: Vascular Surgery

## 2014-07-21 ENCOUNTER — Ambulatory Visit: Payer: PRIVATE HEALTH INSURANCE | Admitting: Vascular Surgery

## 2014-07-21 ENCOUNTER — Ambulatory Visit (INDEPENDENT_AMBULATORY_CARE_PROVIDER_SITE_OTHER): Payer: PRIVATE HEALTH INSURANCE | Admitting: Vascular Surgery

## 2014-07-21 ENCOUNTER — Encounter: Payer: Self-pay | Admitting: Vascular Surgery

## 2014-07-21 VITALS — BP 138/93 | HR 65 | Resp 16 | Ht 72.0 in | Wt 300.0 lb

## 2014-07-21 DIAGNOSIS — I83899 Varicose veins of unspecified lower extremities with other complications: Secondary | ICD-10-CM | POA: Insufficient documentation

## 2014-07-21 DIAGNOSIS — I83892 Varicose veins of left lower extremities with other complications: Secondary | ICD-10-CM

## 2014-07-21 NOTE — Progress Notes (Signed)
Subjective:     Patient ID: Elijah Beard, male   DOB: 30-Mar-1961, 53 y.o.   MRN: [id number]  HPI this 53 year old male returns for continued follow-up regarding his painful varicosities and swelling in the left leg. He has been shown to have gross reflux in the left great saphenous vein supplying painful bulging varicosities. He developed swelling as the day progresses. He has tried long-leg elastic compression stockings 20-30 mm gradient as well as elevation and ibuprofen with no success. His symptoms continue to worsen and are affecting his daily living. He does have history of thrombophlebitis in the left medial calf on 2 occasions most recently in the spring of 2015.  Past Medical History  Diagnosis Date  . Atrial flutter     Status post RFA 10/10 - Dr. Graciela HusbandsKlein, repeat ablation for recurrent by Dr Johney FrameAllred 9/12  . Asthma   . Obstructive sleep apnea   . Bifascicular block     chronic (since at least 2010)    History  Substance Use Topics  . Smoking status: Former Smoker -- 1.00 packs/day for 20 years    Types: Cigarettes    Quit date: 11/17/2003  . Smokeless tobacco: Never Used  . Alcohol Use: No     Comment: On occasion     Family History  Problem Relation Age of Onset  . Cancer Father   . Cancer Mother     No Known Allergies  Current outpatient prescriptions: albuterol (PROVENTIL HFA;VENTOLIN HFA) 108 (90 BASE) MCG/ACT inhaler, Inhale 2 puffs into the lungs every 6 (six) hours as needed for wheezing., Disp: , Rfl: ;  budesonide-formoterol (SYMBICORT) 160-4.5 MCG/ACT inhaler, Inhale 2 puffs into the lungs 2 (two) times daily., Disp: , Rfl:  diltiazem (CARDIZEM) 30 MG tablet, Take 1-2 tablets (30-60 mg total) by mouth 4 (four) times daily as needed., Disp: 30 tablet, Rfl: 2  BP 138/93 mmHg  Pulse 65  Resp 16  Ht 6' (1.829 m)  Wt 300 lb (136.079 kg)  BMI 40.68 kg/m2  Body mass index is 40.68 kg/(m^2).          Review of SystemsDenies chest pain, dyspnea on  exertion.. Does have sleep apnea.     Objective:   Physical Exam BP 138/93 mmHg  Pulse 65  Resp 16  Ht 6' (1.829 m)  Wt 300 lb (136.079 kg)  BMI 40.68 kg/m2  General obese male in no apparent distress alert and oriented 3 Left leg with bulging varicosities in the medial calf pretibial region and lateral distal calf with network of reticular and spider veins. 1+ edema present. No active ulcer noted. 3 posterior Salas pedis pulse palpable.     Assessment:     Patient with gross reflux left great saphenous vein supplying painful bulging varicosities with history of 2 episodes of thrombophlebitis and symptoms now resistant to conservative measures including only elastic compression stockings 20-30 millimeter gradient, elevation, and ibuprofen. Patient needs laser ablation left great saphenous vein attended 20 stab phlebectomy of painful varicosities followed by one course of sclerotherapy.     Plan:     We will proceed with precertification in the near future to perform this to relieve his symptoms

## 2015-12-24 ENCOUNTER — Encounter: Payer: Self-pay | Admitting: Internal Medicine

## 2015-12-24 ENCOUNTER — Ambulatory Visit (INDEPENDENT_AMBULATORY_CARE_PROVIDER_SITE_OTHER): Payer: Self-pay | Admitting: Internal Medicine

## 2015-12-24 VITALS — BP 116/78 | HR 56 | Ht 72.0 in | Wt 287.0 lb

## 2015-12-24 DIAGNOSIS — I83892 Varicose veins of left lower extremities with other complications: Secondary | ICD-10-CM

## 2015-12-24 DIAGNOSIS — G4733 Obstructive sleep apnea (adult) (pediatric): Secondary | ICD-10-CM

## 2015-12-24 DIAGNOSIS — I4891 Unspecified atrial fibrillation: Secondary | ICD-10-CM

## 2015-12-24 MED ORDER — DILTIAZEM HCL 30 MG PO TABS: 30.0000 mg | ORAL_TABLET | Freq: Four times a day (QID) | ORAL | Status: DC | PRN

## 2015-12-24 NOTE — Patient Instructions (Signed)
Your physician recommends that you continue on your current medications as directed. Please refer to the Current Medication list given to you today. Your physician has requested that you have an echocardiogram. Echocardiography is a painless test that uses sound waves to create images of your heart. It provides your doctor with information about the size and shape of your heart and how well your heart's chambers and valves are working. This procedure takes approximately one hour. There are no restrictions for this procedure. Your physician recommends that you schedule a follow-up appointment in: 1 year with Dr. Johney FrameAllred. Please schedule this appointment today.

## 2015-12-24 NOTE — Progress Notes (Signed)
PCP:  Fredirick Maudlin, MD  The patient presents today for routine electrophysiology followup.  I have not seen him for 2 years.  Over the past 2-3 years, he has managed to lose and keep 60 pounds off!  He continues to have stable palpitations.   He has symptoms of afib every 2 weeks.  Episodes are mostly short lived but can last up to 2 days at a time.  Prior event monitor documented PACs and nonsustained atach as the cause.  I suspect afib also.  He reports vagal triggers such as drinking a cold beverage or overeating.  He does not drink much alcohol.  He has chronic difficulty with venous insufficiency of the leg.  He was evaluated by Dr Hart Rochester and laser ablation was advised.  He says that his insurance would not pay for it.  He continues to struggle with pain and swelling in that leg and has clear evidence of venous abnormality on exam. Today, he denies symptoms of  chest pain, orthopnea, PND,  dizziness, presyncope, syncope, or neurologic sequela.   He is compliant now with CPAP.  He has SOB with moderate activity but feels that this is much improved with weight loss.  The patient feels that he is tolerating medications without difficulties and is otherwise without complaint today.   Past Medical History  Diagnosis Date  . Atrial flutter ([facility])     Status post RFA 10/10 - Dr. Graciela Husbands, repeat ablation for recurrent by Dr Johney Frame 9/12  . Asthma   . Obstructive sleep apnea   . Bifascicular block     chronic (since at least 2010)   Past Surgical History  Procedure Laterality Date  . Tonsillectomy    . Nasal sinus surgery    . Atrial ablation surgery      CTI ablation x 2    Current Outpatient Prescriptions  Medication Sig Dispense Refill  . albuterol (PROVENTIL HFA;VENTOLIN HFA) 108 (90 BASE) MCG/ACT inhaler Inhale 2 puffs into the lungs every 6 (six) hours as needed for wheezing.    . budesonide-formoterol (SYMBICORT) 160-4.5 MCG/ACT inhaler Inhale 2 puffs into the lungs 2 (two) times daily.     Marland Kitchen diltiazem (CARDIZEM) 30 MG tablet Take 1-2 tablets (30-60 mg total) by mouth 4 (four) times daily as needed. 30 tablet 2   No current facility-administered medications for this visit.    No Known Allergies  Social History   Social History  . Marital Status: Married    Spouse Name: N/A  . Number of Children: N/A  . Years of Education: N/A   Occupational History  . Not on file.   Social History Main Topics  . Smoking status: Former Smoker -- 1.00 packs/day for 20 years    Types: Cigarettes    Start date: 11/17/1983    Quit date: 11/17/2003  . Smokeless tobacco: Never Used  . Alcohol Use: No     Comment: On occasion   . Drug Use: No  . Sexual Activity: Not on file   Other Topics Concern  . Not on file   Social History Narrative    Family History  Problem Relation Age of Onset  . Cancer Father   . Cancer Mother     Physical Exam: Filed Vitals:   12/24/15 0910  BP: 116/78  Pulse: 56  Height: 6' (1.829 m)  Weight: 287 lb (130.182 kg)  SpO2: 99%    GEN- The patient is overweight appearing, alert and oriented x 3 today.   Head-  normocephalic, atraumatic Eyes-  Sclera clear, conjunctiva pink Ears- hearing intact Oropharynx- clear Neck- supple,   Lungs- Clear to ausculation bilaterally, normal work of breathing Heart- Regular rate and rhythm, no murmurs, rubs or gallops, PMI not laterally displaced GI- soft, NT, ND, + BS Extremities- no clubbing, cyanosis, or edema, + moderate varicosity of the legs,   Neuro- strength and sensation are intact  ekg today reveals sinus rhythm 53 bpm, PR 148 msec, QRS 158 msec with RBBB/ LAHB  Assessment and Plan:  1. Pacs/ nonsustained atach, is suspect afib also.  He has had prior atrial flutter ([facility]) ablationn x 2.  He has not had afib ablation. I have not seen documentation of afib but suspect it by history.  Prior 30 day monitor revealed on pacs and nonsustained atach.  Could consider implantable loop recorder if  symptoms become more bothersome.  For now, would continue lifestyle modification and prn cardizem.  He finds this too be very helpful.  Obtain echo to evaluate for structural changes.  Given sinus bradycardia and bifascicular block, I am reluctant to prescribe daily medicines which could cause further bradycardia.  His options are limited.  I am optimistic that with ongoing lifestyle modification that he will continue to do better.  2. Obesity Weight loss again advised.  I have congratulated him on 60 lb weight reduction!  3. OSA Compliance with CPAP encouraged  4. Venous insufficiency I agree with Dr Hart RochesterLawson that venous ablation is indicated for symptomatic relief I have encouraged the patient to return to follow-up with Dr Hart RochesterLawson  Return in 12 months He will contact my office if problems arise in the interim  Hillis RangeJames Thien Berka MD, [facility] 12/24/2015 9:43 AM

## 2015-12-29 ENCOUNTER — Ambulatory Visit (INDEPENDENT_AMBULATORY_CARE_PROVIDER_SITE_OTHER): Payer: Self-pay

## 2015-12-29 ENCOUNTER — Other Ambulatory Visit: Payer: Self-pay

## 2015-12-29 DIAGNOSIS — I4891 Unspecified atrial fibrillation: Secondary | ICD-10-CM

## 2016-01-12 ENCOUNTER — Telehealth: Payer: Self-pay | Admitting: *Deleted

## 2016-01-12 NOTE — Telephone Encounter (Signed)
Notes Recorded by Hillis RangeJames Allred, MD on 01/03/2016 at 8:23 AM Results reviewed. Please inform pt of result.

## 2016-01-13 NOTE — Telephone Encounter (Signed)
Patient informed. 

## 2016-05-09 ENCOUNTER — Other Ambulatory Visit (HOSPITAL_COMMUNITY): Payer: Self-pay | Admitting: Surgery

## 2016-05-09 DIAGNOSIS — I83028 Varicose veins of left lower extremity with ulcer other part of lower leg: Secondary | ICD-10-CM

## 2016-05-09 DIAGNOSIS — L97829 Non-pressure chronic ulcer of other part of left lower leg with unspecified severity: Secondary | ICD-10-CM

## 2016-05-09 DIAGNOSIS — IMO0001 Reserved for inherently not codable concepts without codable children: Secondary | ICD-10-CM

## 2016-05-09 DIAGNOSIS — K219 Gastro-esophageal reflux disease without esophagitis: Principal | ICD-10-CM

## 2016-05-09 DIAGNOSIS — R600 Localized edema: Secondary | ICD-10-CM

## 2016-05-17 ENCOUNTER — Ambulatory Visit (HOSPITAL_COMMUNITY)
Admission: RE | Admit: 2016-05-17 | Discharge: 2016-05-17 | Disposition: A | Discharge status: Home / Self Care | Payer: No Typology Code available for payment source | Source: Ambulatory Visit | Attending: Surgery | Admitting: Surgery

## 2016-05-17 ENCOUNTER — Other Ambulatory Visit (HOSPITAL_COMMUNITY): Payer: Self-pay | Admitting: Surgery

## 2016-05-17 DIAGNOSIS — I872 Venous insufficiency (chronic) (peripheral): Secondary | ICD-10-CM | POA: Insufficient documentation

## 2016-05-17 DIAGNOSIS — I83028 Varicose veins of left lower extremity with ulcer other part of lower leg: Secondary | ICD-10-CM

## 2016-05-17 DIAGNOSIS — R600 Localized edema: Secondary | ICD-10-CM | POA: Diagnosis present

## 2016-05-17 DIAGNOSIS — IMO0001 Reserved for inherently not codable concepts without codable children: Secondary | ICD-10-CM

## 2016-05-17 DIAGNOSIS — L97829 Non-pressure chronic ulcer of other part of left lower leg with unspecified severity: Secondary | ICD-10-CM

## 2016-05-17 DIAGNOSIS — K219 Gastro-esophageal reflux disease without esophagitis: Secondary | ICD-10-CM

## 2016-05-17 DIAGNOSIS — I82812 Embolism and thrombosis of superficial veins of left lower extremities: Secondary | ICD-10-CM | POA: Insufficient documentation

## 2016-09-08 ENCOUNTER — Ambulatory Visit: Admit: 2016-09-08 | Payer: No Typology Code available for payment source | Admitting: Surgery

## 2016-09-08 SURGERY — RADIOFREQUENCY ABLATION, VEIN
Anesthesia: LOCAL | Laterality: Left

## 2016-09-22 ENCOUNTER — Ambulatory Visit: Admit: 2016-09-22 | Payer: No Typology Code available for payment source | Admitting: Surgery

## 2016-09-22 SURGERY — RADIOFREQUENCY ABLATION, VEIN
Anesthesia: LOCAL | Laterality: Left

## 2016-10-02 ENCOUNTER — Encounter: Payer: Self-pay | Admitting: Internal Medicine

## 2016-12-22 ENCOUNTER — Encounter: Payer: PRIVATE HEALTH INSURANCE | Admitting: Internal Medicine

## 2016-12-29 ENCOUNTER — Ambulatory Visit: Payer: No Typology Code available for payment source | Admitting: Internal Medicine

## 2017-04-13 ENCOUNTER — Ambulatory Visit (INDEPENDENT_AMBULATORY_CARE_PROVIDER_SITE_OTHER): Payer: Self-pay | Admitting: Internal Medicine

## 2017-04-13 ENCOUNTER — Encounter: Payer: Self-pay | Admitting: Internal Medicine

## 2017-04-13 VITALS — BP 120/90 | HR 79 | Ht 72.0 in | Wt 282.0 lb

## 2017-04-13 DIAGNOSIS — R002 Palpitations: Secondary | ICD-10-CM

## 2017-04-13 DIAGNOSIS — I4891 Unspecified atrial fibrillation: Secondary | ICD-10-CM

## 2017-04-13 MED ORDER — DILTIAZEM HCL 30 MG PO TABS: 30.0000 mg | ORAL_TABLET | Freq: Four times a day (QID) | ORAL | 3 refills | Status: DC | PRN

## 2017-04-13 NOTE — Progress Notes (Signed)
   PCP: Elijah Beard, Edward, MD   Tenna DelaineCarl W Beard is a 56 y.o. male who presents today for routine electrophysiology followup.  Since last being seen in our clinic, the patient reports doing very well.  He is very active.  He has been able to keep his weight off.  L leg venous insufficiency and swelling is his primary concern again today.  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   He has not had sustained atrial arrhythmias in several years but has rare palpitations, lasting < 1 minute.  Today, he denies symptoms of palpitations, chest pain, shortness of breath,  dizziness, presyncope, or syncope.  The patient is otherwise without complaint today.   Past Medical History:  Diagnosis Date  . Asthma   . Atrial flutter (HCC)    Status post RFA 10/10 - Elijah. Graciela Beard, repeat ablation for recurrent by Elijah Beard 9/12  . Bifascicular block    chronic (since at least 2010)  . Obstructive sleep apnea    Past Surgical History:  Procedure Laterality Date  . ATRIAL ABLATION SURGERY     CTI ablation x 2  . NASAL SINUS SURGERY    . TONSILLECTOMY      ROS- all systems are reviewed and negatives except as per HPI above  Current Outpatient Prescriptions  Medication Sig Dispense Refill  . albuterol (PROVENTIL HFA;VENTOLIN HFA) 108 (90 BASE) MCG/ACT inhaler Inhale 2 puffs into the lungs every 6 (six) hours as needed for wheezing.    . budesonide-formoterol (SYMBICORT) 160-4.5 MCG/ACT inhaler Inhale 2 puffs into the lungs 2 (two) times daily.    Marland Kitchen. diltiazem (CARDIZEM) 30 MG tablet Take 1-2 tablets (30-60 mg total) by mouth 4 (four) times daily as needed. 30 tablet 3   No current facility-administered medications for this visit.     Physical Exam: Vitals:   04/13/17 0901  BP: 120/90  Pulse: 79  SpO2: 98%  Weight: 282 lb (127.9 kg)  Height: 6' (1.829 m)    GEN- The patient is well appearing, alert and oriented x 3 today.   Head- normocephalic, atraumatic Eyes-  Sclera clear,  conjunctiva pink Ears- hearing intact Oropharynx- clear Lungs- Clear to ausculation bilaterally, normal work of breathing Heart- Regular rate and rhythm, no murmurs, rubs or gallops, PMI not laterally displaced GI- soft, NT, ND, + BS Extremities- no clubbing, cyanosis, or edema Skin- several new tatoos  EKG tracing ordered today is personally reviewed and shows sinus rhythm 71 bpm, PR 156 msec, bifasicular block is unchanged from prior ekg  4/17 echo is reviewed with patient in detail today,  EF normal, mild LVH, no significant valve issues  Assessment and Plan:  1. PACs, nonsustained atach/ palpitations Well controlled He is s/p prior CTI ablation He has not had afib documented He has done amazing with lifestyle modification He has prn diltiazem but has not taken this  2. Obesity Body mass index is 38.25 kg/m. He has lost 65 lbs and has managed to keep this off for several years!  He is walking 2 miles every day without limitation  3. OSA Compliance with CPAP is encouraged  Return to see me in a year unless problems arise  Elijah RangeJames Kaidence Sant MD, [facility] 04/13/2017 9:25 AM

## 2017-04-13 NOTE — Patient Instructions (Addendum)
Medication Instructions:   Your physician recommends that you continue on your current medications as directed. Please refer to the Current Medication list given to you today.  Labwork:  NONE  Testing/Procedures:  NONE  Follow-Up: Your physician recommends that you schedule a follow-up appointment in: 1 year. Please schedule this appointment today before leaving the office.  Any Other Special Instructions Will Be Listed Below (If Applicable).  If you need a refill on your cardiac medications before your next appointment, please call your pharmacy. 

## 2017-05-10 ENCOUNTER — Encounter (INDEPENDENT_AMBULATORY_CARE_PROVIDER_SITE_OTHER): Payer: Self-pay | Admitting: *Deleted

## 2018-04-19 ENCOUNTER — Ambulatory Visit: Payer: Self-pay | Admitting: Internal Medicine

## 2018-04-22 ENCOUNTER — Encounter: Payer: Self-pay | Admitting: Cardiovascular Disease

## 2018-08-23 ENCOUNTER — Encounter: Payer: Self-pay | Admitting: Internal Medicine

## 2018-08-23 ENCOUNTER — Ambulatory Visit (INDEPENDENT_AMBULATORY_CARE_PROVIDER_SITE_OTHER): Payer: Self-pay | Admitting: Internal Medicine

## 2018-08-23 VITALS — BP 122/77 | HR 74 | Ht 72.0 in | Wt 238.8 lb

## 2018-08-23 DIAGNOSIS — I471 Supraventricular tachycardia: Secondary | ICD-10-CM

## 2018-08-23 DIAGNOSIS — I4891 Unspecified atrial fibrillation: Secondary | ICD-10-CM

## 2018-08-23 DIAGNOSIS — I491 Atrial premature depolarization: Secondary | ICD-10-CM

## 2018-08-23 MED ORDER — DILTIAZEM HCL 30 MG PO TABS: 30.0000 mg | ORAL_TABLET | Freq: Four times a day (QID) | ORAL | 3 refills | Status: DC | PRN

## 2018-08-23 NOTE — Patient Instructions (Signed)
Medication Instructions:  Continue all current medications.  Labwork: none  Testing/Procedures: none  Follow-Up: 1 year   Any Other Special Instructions Will Be Listed Below (If Applicable).  If you need a refill on your cardiac medications before your next appointment, please call your pharmacy.  

## 2018-08-23 NOTE — Progress Notes (Signed)
   PCP: Kari BaarsHawkins, Edward, MD   Primary EP: Dr Claud KelpAllred  Elijah Beard is a 57 y.o. male who presents today for routine electrophysiology followup.  Since last being seen in our clinic, the patient reports doing very well.  He has made great progress with lifestyle change!   Today, he denies symptoms of palpitations, chest pain, shortness of breath,  lower extremity edema, dizziness, presyncope, or syncope.  The patient is otherwise without complaint today.   Past Medical History:  Diagnosis Date  . Asthma   . Atrial flutter (HCC)    Status post RFA 10/10 - Dr. Graciela HusbandsKlein, repeat ablation for recurrent by Dr Johney FrameAllred 9/12  . Bifascicular block    chronic (since at least 2010)  . Obstructive sleep apnea    Past Surgical History:  Procedure Laterality Date  . ATRIAL ABLATION SURGERY     CTI ablation x 2  . NASAL SINUS SURGERY    . TONSILLECTOMY      ROS- all systems are reviewed and negatives except as per HPI above  Current Outpatient Medications  Medication Sig Dispense Refill  . albuterol (PROVENTIL HFA;VENTOLIN HFA) 108 (90 BASE) MCG/ACT inhaler Inhale 2 puffs into the lungs every 6 (six) hours as needed for wheezing.    . budesonide-formoterol (SYMBICORT) 160-4.5 MCG/ACT inhaler Inhale 2 puffs into the lungs 2 (two) times daily.    Marland Kitchen. diltiazem (CARDIZEM) 30 MG tablet Take 1-2 tablets (30-60 mg total) by mouth 4 (four) times daily as needed. 30 tablet 3   No current facility-administered medications for this visit.     Physical Exam: Vitals:   08/23/18 0940  BP: 122/77  Pulse: 74  SpO2: 99%  Weight: 238 lb 12.8 oz (108.3 kg)  Height: 6' (1.829 m)    GEN- The patient is well appearing, alert and oriented x 3 today.   Head- normocephalic, atraumatic Eyes-  Sclera clear, conjunctiva pink Ears- hearing intact Oropharynx- clear Lungs- Clear to ausculation bilaterally, normal work of breathing Heart- Regular rate and rhythm, no murmurs, rubs or gallops, PMI not laterally  displaced GI- soft, NT, ND, + BS Extremities- no clubbing, cyanosis, or edema  Wt Readings from Last 3 Encounters:  08/23/18 238 lb 12.8 oz (108.3 kg)  04/13/17 282 lb (127.9 kg)  12/24/15 287 lb (130.2 kg)    EKG tracing ordered today is personally reviewed and shows sinus rhythm, RBBB, LAHB (chronic)  Assessment and Plan:  1. Nonsustained atach/ PACs Palpitations are stable S/p prior CTI ablation No documented afib We discussed role of long term monitoring.  He is clear that he does not wish to have this done currently  2.  OSA He says that he no longer needs CPAP with weight loss  3. Obesity Body mass index is 32.39 kg/m.  He has lost 120 lbs totat! He has lost 50 lbs since I saw him last!!  Return in a year  Hillis RangeJames Hadessah Grennan MD, [facility] 08/23/2018 10:41 AM

## 2019-08-22 ENCOUNTER — Encounter: Payer: Self-pay | Admitting: Internal Medicine

## 2019-08-22 ENCOUNTER — Telehealth (INDEPENDENT_AMBULATORY_CARE_PROVIDER_SITE_OTHER): Payer: Medicaid - Out of State | Admitting: Internal Medicine

## 2019-08-22 VITALS — BP 120/72 | HR 78 | Ht 71.0 in | Wt 243.0 lb

## 2019-08-22 DIAGNOSIS — I491 Atrial premature depolarization: Secondary | ICD-10-CM | POA: Diagnosis not present

## 2019-08-22 DIAGNOSIS — G4733 Obstructive sleep apnea (adult) (pediatric): Secondary | ICD-10-CM

## 2019-08-22 DIAGNOSIS — I4719 Other supraventricular tachycardia: Secondary | ICD-10-CM

## 2019-08-22 DIAGNOSIS — I471 Supraventricular tachycardia: Secondary | ICD-10-CM | POA: Diagnosis not present

## 2019-08-22 MED ORDER — DILTIAZEM HCL 30 MG PO TABS: 30.0000 mg | ORAL_TABLET | Freq: Four times a day (QID) | ORAL | 3 refills | Status: DC | PRN

## 2019-08-22 NOTE — Progress Notes (Signed)
Electrophysiology TeleHealth Note   Due to national recommendations of social distancing due to [place] 19, an audio telehealth visit is felt to be most appropriate for this patient at this time.  Verbal consent was obtained by me for the telehealth visit today.  The patient does not have capability for a virtual visit.  A phone visit is therefore required today.   Date:  08/22/2019   ID:  Elijah Beard, DOB 1961-06-11, MRN [id number]  Location: patient's home  Provider location:  [facility]  Evaluation Performed: Follow-up visit  PCP:  Sinda Du, MD   Electrophysiologist:  Dr Rayann Heman  Chief Complaint:  Follow up   History of Present Illness:    Elijah Beard is a 58 y.o. male who presents via telehealth conferencing today.  Since last being seen in our clinic, the patient reports doing very well. 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  Today, he denies symptoms of palpitations (above baseline), chest pain, shortness of breath,  lower extremity edema, dizziness, presyncope, or syncope.  The patient is otherwise without complaint today.  The patient denies symptoms of fevers, chills, cough, or new SOB worrisome for COVID 19.  Past Medical History:  Diagnosis Date  . Asthma   . Atrial flutter ([place])    Status post RFA 10/10 - Dr. Caryl Comes, repeat ablation for recurrent by Dr Rayann Heman 9/12  . Bifascicular block    chronic (since at least 2010)  . Obstructive sleep apnea     Past Surgical History:  Procedure Laterality Date  . ATRIAL ABLATION SURGERY     CTI ablation x 2  . NASAL SINUS SURGERY    . TONSILLECTOMY      Current Outpatient Medications  Medication Sig Dispense Refill  . albuterol (PROVENTIL HFA;VENTOLIN HFA) 108 (90 BASE) MCG/ACT inhaler Inhale 2 puffs into the lungs every 6 (six) hours as needed for wheezing.    . budesonide-formoterol (SYMBICORT) 160-4.5 MCG/ACT inhaler Inhale 2 puffs into the lungs 2 (two) times daily.    Marland Kitchen  diltiazem (CARDIZEM) 30 MG tablet Take 1-2 tablets (30-60 mg total) by mouth 4 (four) times daily as needed. 30 tablet 3   No current facility-administered medications for this visit.     Allergies:   Patient has no known allergies.   Social History:  The patient  reports that he quit smoking about 15 years ago. His smoking use included cigarettes. He started smoking about 35 years ago. He has a 20.00 pack-year smoking history. He has never used smokeless tobacco. He reports that he does not drink alcohol or use drugs.   Family History:  The patient's  family history includes Cancer in his father and mother.   ROS:  Please see the history of present illness.   All other systems are personally reviewed and negative.    Exam:    Vital Signs:  BP 120/72   Pulse 78   Ht 5\' 11"  (1.803 m)   Wt 243 lb (110.2 kg)   BMI 33.89 kg/m   Well sounding, alert and conversant, regular work of breathing, was working under a house at time of visit  Labs/Other Tests and Data Reviewed:    Recent Labs: No results found for requested labs within last 8760 hours.   Wt Readings from Last 3 Encounters:  08/22/19 243 lb (110.2 kg)  08/23/18 238 lb 12.8 oz (108.3 kg)  04/13/17 282 lb (127.9 kg)      ASSESSMENT &  PLAN:    1.  Nonsustained atach/PAC's Palpitations are stable S/p CTI ablation No documented atrial fibrillation  2.  OSA No longer on CPAP with weight loss  3.  Obesity Body mass index is 33.89 kg/m. He has gained some weight back with COVID  Follow-up:  1 year    Patient Risk:  after full review of this patients clinical status, I feel that they are at moderate risk at this time.  Today, I have spent 15 minutes with the patient with telehealth technology discussing arrhythmia management .    Randolm Idol, MD  08/22/2019 10:26 AM     [facility] [address] [place] [address] [phone] (office) [phone] (fax)

## 2020-08-20 ENCOUNTER — Ambulatory Visit: Payer: Medicaid - Out of State | Admitting: Internal Medicine

## 2020-09-03 ENCOUNTER — Ambulatory Visit: Payer: Medicaid - Out of State | Admitting: Internal Medicine

## 2020-09-24 ENCOUNTER — Ambulatory Visit: Payer: Medicaid - Out of State | Admitting: Internal Medicine

## 2021-01-21 ENCOUNTER — Ambulatory Visit: Payer: Medicaid - Out of State | Admitting: Internal Medicine

## 2021-02-25 ENCOUNTER — Ambulatory Visit (INDEPENDENT_AMBULATORY_CARE_PROVIDER_SITE_OTHER): Payer: Medicaid - Out of State | Admitting: Internal Medicine

## 2021-02-25 ENCOUNTER — Other Ambulatory Visit: Payer: Self-pay

## 2021-02-25 VITALS — BP 112/74 | HR 70 | Ht 70.0 in | Wt 272.6 lb

## 2021-02-25 DIAGNOSIS — I471 Supraventricular tachycardia: Secondary | ICD-10-CM | POA: Diagnosis not present

## 2021-02-25 DIAGNOSIS — I872 Venous insufficiency (chronic) (peripheral): Secondary | ICD-10-CM | POA: Diagnosis not present

## 2021-02-25 DIAGNOSIS — I8393 Asymptomatic varicose veins of bilateral lower extremities: Secondary | ICD-10-CM

## 2021-02-25 DIAGNOSIS — I4892 Unspecified atrial flutter: Secondary | ICD-10-CM | POA: Diagnosis not present

## 2021-02-25 DIAGNOSIS — I4719 Other supraventricular tachycardia: Secondary | ICD-10-CM

## 2021-02-25 NOTE — Progress Notes (Signed)
   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  Since last being seen in our clinic, the patient reports doing reasonably well.  He has remarried and reports significant family stress.  He has gained about 40 lbs.  He has started zoloft.  He has rare palpitations when he is "upset".  Today, he denies symptoms of chest pain, shortness of breath,  lower extremity edema, dizziness, presyncope, or syncope.   + L leg varicosities with tenderness. The patient is otherwise without complaint today.   Past Medical History:  Diagnosis Date   Asthma    Atrial flutter (HCC)    Status post RFA 10/10 - Dr. Graciela Husbands, repeat ablation for recurrent by Dr Johney Frame 9/12   Bifascicular block    chronic (since at least 2010)   Obstructive sleep apnea    Past Surgical History:  Procedure Laterality Date   ATRIAL ABLATION SURGERY     CTI ablation x 2   NASAL SINUS SURGERY     TONSILLECTOMY      ROS- all systems are reviewed and negatives except as per HPI above  Current Outpatient Medications  Medication Sig Dispense Refill   albuterol (PROVENTIL HFA;VENTOLIN HFA) 108 (90 BASE) MCG/ACT inhaler Inhale 2 puffs into the lungs every 6 (six) hours as needed for wheezing.     budesonide-formoterol (SYMBICORT) 160-4.5 MCG/ACT inhaler Inhale 2 puffs into the lungs 2 (two) times daily.     diltiazem (CARDIZEM) 30 MG tablet Take 1-2 tablets (30-60 mg total) by mouth 4 (four) times daily as needed. 30 tablet 3   hydrOXYzine (ATARAX/VISTARIL) 25 MG tablet Take 25 mg by mouth 3 (three) times daily as needed for sleep.     sertraline (ZOLOFT) 25 MG tablet Take 25 mg by mouth daily.     No current facility-administered medications for this visit.    Physical Exam: Vitals:   02/25/21 0846  BP: 112/74  Pulse: 70  SpO2: 98%  Height: 5\' 10"  (1.778 m)    GEN- The patient is well appearing, alert and oriented x 3  today.   Head- normocephalic, atraumatic Eyes-  Sclera clear, conjunctiva pink Ears- hearing intact Oropharynx- clear Lungs-   normal work of breathing Heart- Regular rate and rhythm  GI- soft,  Extremities- no clubbing, cyanosis, or edema,  + L leg vericosities  Wt Readings from Last 3 Encounters:  08/22/19 243 lb (110.2 kg)  08/23/18 238 lb 12.8 oz (108.3 kg)  04/13/17 282 lb (127.9 kg)    EKG tracing ordered today is personally reviewed and shows sinus rhythm with RBBB, LAHB (similar to prior tracing 2019)  Assessment and Plan:  Nonsustained atach/ PACs Well controlled No history of afib S/p prior CTI for atrial flutter Prn diltiazem  2. Obesity Body mass index is 39.11 kg/m. Lifestyle modification advised  3. Venous disease Refer to [facility]  Return in a year  2020 MD, [facility] 02/25/2021 8:54 AM

## 2021-02-25 NOTE — Patient Instructions (Signed)
Medication Instructions:  Continue all current medications.  Labwork: none  Testing/Procedures: none  Follow-Up: 1 year   Any Other Special Instructions Will Be Listed Below (If Applicable). You have been referred to:  Dr. Randie Heinz - [facility]   If you need a refill on your cardiac medications before your next appointment, please call your pharmacy.

## 2021-04-01 ENCOUNTER — Other Ambulatory Visit: Payer: Self-pay

## 2021-04-01 DIAGNOSIS — I872 Venous insufficiency (chronic) (peripheral): Secondary | ICD-10-CM

## 2021-04-08 ENCOUNTER — Encounter: Payer: Medicaid - Out of State | Admitting: Vascular Surgery

## 2021-04-08 ENCOUNTER — Encounter (HOSPITAL_COMMUNITY): Payer: Medicaid - Out of State

## 2021-09-16 ENCOUNTER — Other Ambulatory Visit: Payer: Self-pay

## 2021-09-16 ENCOUNTER — Encounter (HOSPITAL_COMMUNITY): Payer: Self-pay

## 2021-09-16 ENCOUNTER — Emergency Department (HOSPITAL_COMMUNITY)
Admission: EM | Admit: 2021-09-16 | Discharge: 2021-09-17 | Disposition: A | Discharge status: Home / Self Care | Payer: Medicaid - Out of State | Attending: Emergency Medicine | Admitting: Emergency Medicine

## 2021-09-16 DIAGNOSIS — J45909 Unspecified asthma, uncomplicated: Secondary | ICD-10-CM | POA: Diagnosis not present

## 2021-09-16 DIAGNOSIS — Z87891 Personal history of nicotine dependence: Secondary | ICD-10-CM | POA: Insufficient documentation

## 2021-09-16 DIAGNOSIS — Z7901 Long term (current) use of anticoagulants: Secondary | ICD-10-CM | POA: Diagnosis not present

## 2021-09-16 DIAGNOSIS — R5383 Other fatigue: Secondary | ICD-10-CM

## 2021-09-16 DIAGNOSIS — U071 COVID-19: Secondary | ICD-10-CM | POA: Insufficient documentation

## 2021-09-16 LAB — RESP PANEL BY RT-PCR (FLU A&B, COVID) ARPGX2
Influenza A by PCR: NEGATIVE
Influenza B by PCR: NEGATIVE
SARS Coronavirus 2 by RT PCR: POSITIVE — AB

## 2021-09-16 MED ORDER — SODIUM CHLORIDE 0.9 % IV BOLUS
1000.0000 mL | Freq: Once | INTRAVENOUS | Status: AC
  Administered 2021-09-17: 1000 mL via INTRAVENOUS

## 2021-09-16 NOTE — ED Triage Notes (Signed)
Pt reports flu like sx x 6 weeks. Was seen at [facility], prescribed anbx and finished it, also took prednisone 6 weeks ago. Symptoms persist.

## 2021-09-16 NOTE — ED Provider Notes (Signed)
[facility] EMERGENCY DEPARTMENT Provider Note   CSN: [id number] Arrival date & time: 09/16/21  2121     History No chief complaint on file.   Elijah Beard is a 60 y.o. male.  Patient with history of asthma on as needed albuterol, atrial flutter not on anticoagulation presenting with 6-week history of fatigue.  States he has had diffuse body aches, no energy and chills for the past 6 weeks.  Last tested for COVID several months ago.  Over the past 2 days he developed tightness in his chest, shortness of breath, productive cough, chills, fever up to 102, headache and body aches.  Took Tylenol prior to arrival for temperature of 104.  Feels tight in his chest and shortness of breath with productive cough of yellow mucus.  Denies any abdominal pain.  Denies any nausea or vomiting.  Denies any diarrhea.  Does have headache and diffuse aches and chills.  Has had sick contacts at home. States the body aches and having no energy has been ongoing for many weeks but then shortness of breath, fatigue and coughing is new  The history is provided by the patient and the spouse.      Past Medical History:  Diagnosis Date   Asthma    Atrial flutter (HCC)    Status post RFA 10/10 - Dr. 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   Varicose veins of lower extremities with other complications 04/20/2014   Venous insufficiency 05/01/2013   Tachycardia 09/21/2011   Long term (current) use of anticoagulants 03/07/2011   ATRIAL FLUTTER 08/13/2009   ASTHMA 08/13/2009   MORBID OBESITY 08/11/2009   Obstructive sleep apnea 08/11/2009   ATRIAL FIBRILLATION 08/11/2009   PERS HX NONCOMPLIANCE W/MED TX PRS HAZARDS HLTH 08/11/2009    Past Surgical History:  Procedure Laterality Date   ATRIAL  ABLATION SURGERY     CTI ablation x 2   NASAL SINUS SURGERY     TONSILLECTOMY         Family History  Problem Relation Age of Onset   Cancer Father    Cancer Mother     Social History   Tobacco Use   Smoking status: Former    Packs/day: 1.00    Years: 20.00    Pack years: 20.00    Types: Cigarettes    Start date: 11/17/1983    Quit date: 11/17/2003    Years since quitting: 17.8   Smokeless tobacco: Never  Substance Use Topics   Alcohol use: No    Alcohol/week: 0.0 standard drinks    Comment: On occasion    Drug use: No    Home Medications Prior to Admission medications   Medication Sig Start Date End Date Taking? Authorizing Provider  albuterol (PROVENTIL HFA;VENTOLIN HFA) 108 (90 BASE) MCG/ACT inhaler Inhale 2 puffs into the lungs every 6 (six) hours as needed for wheezing.    [provider]  budesonide-formoterol (SYMBICORT) 160-4.5 MCG/ACT inhaler Inhale 2 puffs into the lungs 2 (two) times daily.    [provider]  diltiazem (CARDIZEM) 30 MG tablet Take 1-2 tablets (30-60 mg total) by mouth 4 (four) times daily as needed. 08/22/19   Allred, Fayrene Fearing, MD  hydrOXYzine (ATARAX/VISTARIL) 25 MG tablet Take 25 mg by mouth 3 (  three) times daily as needed for sleep. 01/04/21   [provider]  sertraline (ZOLOFT) 25 MG tablet Take 25 mg by mouth daily. 01/04/21   [provider]    Allergies    Patient has no known allergies.  Review of Systems   Review of Systems  Constitutional:  Positive for activity change, appetite change, fatigue and fever.  HENT:  Positive for congestion and rhinorrhea.   Respiratory:  Positive for cough, chest tightness and shortness of breath.   Cardiovascular:  Negative for chest pain.  Gastrointestinal:  Negative for abdominal pain, nausea and vomiting.  Genitourinary:  Negative for dysuria, hematuria and scrotal swelling.  Musculoskeletal:  Positive for arthralgias and myalgias.  Skin:  Negative for rash.   Neurological:  Positive for weakness and headaches.   all other systems are negative except as noted in the HPI and PMH.   Physical Exam Updated Vital Signs BP 100/67    Pulse 80    Temp 98.9 F (37.2 C) (Oral)    Resp 16    Ht 5\' 10"  (1.778 m)    Wt 123.7 kg    SpO2 96%    BMI 39.13 kg/m   Physical Exam Vitals and nursing note reviewed.  Constitutional:      General: He is not in acute distress.    Appearance: He is well-developed. He is obese.     Comments: Fatigued appearing, no increased work of breathing  HENT:     Head: Normocephalic and atraumatic.     Mouth/Throat:     Pharynx: No oropharyngeal exudate.  Eyes:     Conjunctiva/sclera: Conjunctivae normal.     Pupils: Pupils are equal, round, and reactive to light.  Neck:     Comments: No meningismus. Cardiovascular:     Rate and Rhythm: Normal rate and regular rhythm.     Heart sounds: Normal heart sounds. No murmur heard. Pulmonary:     Effort: Pulmonary effort is normal. No respiratory distress.     Breath sounds: Normal breath sounds.  Abdominal:     Palpations: Abdomen is soft.     Tenderness: There is no abdominal tenderness. There is no guarding or rebound.  Musculoskeletal:        General: No tenderness. Normal range of motion.     Cervical back: Normal range of motion and neck supple.  Skin:    General: Skin is warm.  Neurological:     Mental Status: He is alert and oriented to person, place, and time.     Cranial Nerves: No cranial nerve deficit.     Motor: No abnormal muscle tone.     Coordination: Coordination normal.     Comments:  5/5 strength throughout. CN 2-12 intact.Equal grip strength.   Psychiatric:        Behavior: Behavior normal.    ED Results / Procedures / Treatments   Labs (all labs ordered are listed, but only abnormal results are displayed) Labs Reviewed  RESP PANEL BY RT-PCR (FLU A&B, COVID) ARPGX2 - Abnormal; Notable for the following components:      Result Value   SARS  Coronavirus 2 by RT PCR POSITIVE (*)    All other components within normal limits  CBC WITH DIFFERENTIAL/PLATELET - Abnormal; Notable for the following components:   WBC 3.7 (*)    Platelets 96 (*)    All other components within normal limits  BASIC METABOLIC PANEL - Abnormal; Notable for the following components:   BUN 23 (*)  Calcium 8.2 (*)    All other components within normal limits  D-DIMER, QUANTITATIVE - Abnormal; Notable for the following components:   D-Dimer, Quant 1.21 (*)    All other components within normal limits  CK  TROPONIN I (HIGH SENSITIVITY)  TROPONIN I (HIGH SENSITIVITY)    EKG EKG Interpretation  Date/Time:  Saturday September 17 2021 01:25:51 EST Ventricular Rate:  84 PR Interval:  154 QRS Duration: 151 QT Interval:  395 QTC Calculation: 467 R Axis:   -89 Text Interpretation: Sinus rhythm RBBB and LAFB Abnormal lateral Q waves No significant change was found Confirmed by Ezequiel Essex [phone]) on 09/17/2021 1:42:18 AM  Radiology CT Angio Chest PE W and/or Wo Contrast  Result Date: 09/17/2021 CLINICAL DATA:  Positive D-dimer.  Weakness and shortness of breath. EXAM: CT ANGIOGRAPHY CHEST WITH CONTRAST TECHNIQUE: Multidetector CT imaging of the chest was performed using the standard protocol during bolus administration of intravenous contrast. Multiplanar CT image reconstructions and MIPs were obtained to evaluate the vascular anatomy. CONTRAST:  166mL OMNIPAQUE IOHEXOL 350 MG/ML SOLN COMPARISON:  12/19/6 FINDINGS: Cardiovascular: Satisfactory opacification of the pulmonary arteries to the segmental level. No evidence of pulmonary embolism. Aortic atherosclerosis. Mild coronary artery calcifications. No pericardial effusion. Heart size appears normal. Mediastinum/Nodes: No enlarged mediastinal, hilar, or axillary lymph nodes. Thyroid gland, trachea, and esophagus demonstrate no significant findings. Lungs/Pleura: Dependent changes are identified within both  lung bases. No pleural effusion, interstitial edema, or airspace consolidation. No pneumothorax identified. Calcified granuloma noted within the left lung base. No suspicious nodule or mass. Upper Abdomen: No acute abnormality. Musculoskeletal: Thoracic spondylosis. No acute or suspicious osseous findings. Review of the MIP images confirms the above findings. IMPRESSION: 1. No evidence for acute pulmonary embolus. 2. Aortic Atherosclerosis (ICD10-I70.0). Coronary artery calcifications. Electronically Signed   By: Kerby Moors M.D.   On: 09/17/2021 05:24   DG Chest Portable 1 View  Result Date: 09/17/2021 CLINICAL DATA:  Coughing, fever, and tested positive for COVID today. EXAM: PORTABLE CHEST 1 VIEW COMPARISON:  PA and lateral 07/25/2021 FINDINGS: The heart size and mediastinal contours are within normal limits. Both lungs are clear. The visualized skeletal structures are unremarkable apart from moderate thoracic spondylosis. IMPRESSION: No active disease.  Stable chest. Electronically Signed   By: Telford Nab M.D.   On: 09/17/2021 00:48    Procedures Procedures   Medications Ordered in ED Medications  sodium chloride 0.9 % bolus 1,000 mL (has no administration in time range)    ED Course  I have reviewed the triage vital signs and the nursing notes.  Pertinent labs & imaging results that were available during my care of the patient were reviewed by me and considered in my medical decision making (see chart for details).    MDM Rules/Calculators/A&P                         Several weeks of chills, fatigue, body aches.  No increased work of breathing or hypoxia.  Comes in today with fever, shortness of breath, chest tightness and cough.  COVID test is positive today  Chest x-ray is negative.  Lungs are clear.  No hypoxia.  No indication for steroids.  Labs are reassuring without other explanation for patient's fatigue.  No anemia or electrolyte abnormalities.  Troponin negative x2.   Low suspicion for ACS.  CT negative for pulmonary embolism or pneumonia.  Patient able to ambulate without hypoxia.  He is breathing comfortably without  any respiratory distress.  Appears stable for discharge home with supportive care instructions for COVID.  He is not in the window for Paxlovid.  Recommend antipyretics, PCP follow-up, cough suppressants. Return to the ED with difficulty breathing, chest pain, intractable nausea and vomiting, any other concerns    Final Clinical Impression(s) / ED Diagnoses Final diagnoses:  COVID-19  Other fatigue    Rx / DC Orders ED Discharge Orders     None        Zyrah Wiswell, Annie Main, MD 09/17/21 [phone]

## 2021-09-17 ENCOUNTER — Emergency Department (HOSPITAL_COMMUNITY): Payer: Medicaid - Out of State

## 2021-09-17 LAB — TROPONIN I (HIGH SENSITIVITY)
Troponin I (High Sensitivity): 5 ng/L (ref <18)
Troponin I (High Sensitivity): 5 ng/L (ref <18)

## 2021-09-17 LAB — CBC WITH DIFFERENTIAL/PLATELET
Abs Immature Granulocytes: 0.01 10*3/uL (ref 0.00–0.07)
Basophils Absolute: 0 10*3/uL (ref 0.0–0.1)
Basophils Relative: 1 %
Eosinophils Absolute: 0.1 10*3/uL (ref 0.0–0.5)
Eosinophils Relative: 2 %
HCT: 45.1 % (ref 39.0–52.0)
Hemoglobin: 15.1 g/dL (ref 13.0–17.0)
Immature Granulocytes: 0 %
Lymphocytes Relative: 20 %
Lymphs Abs: 0.7 10*3/uL (ref 0.7–4.0)
MCH: 31.9 pg (ref 26.0–34.0)
MCHC: 33.5 g/dL (ref 30.0–36.0)
MCV: 95.3 fL (ref 80.0–100.0)
Monocytes Absolute: 0.7 10*3/uL (ref 0.1–1.0)
Monocytes Relative: 18 %
Neutro Abs: 2.2 10*3/uL (ref 1.7–7.7)
Neutrophils Relative %: 59 %
Platelets: 96 10*3/uL — ABNORMAL LOW (ref 150–400)
RBC: 4.73 MIL/uL (ref 4.22–5.81)
RDW: 13.4 % (ref 11.5–15.5)
WBC: 3.7 10*3/uL — ABNORMAL LOW (ref 4.0–10.5)
nRBC: 0 % (ref 0.0–0.2)

## 2021-09-17 LAB — BASIC METABOLIC PANEL
Anion gap: 9 (ref 5–15)
BUN: 23 mg/dL — ABNORMAL HIGH (ref 6–20)
CO2: 26 mmol/L (ref 22–32)
Calcium: 8.2 mg/dL — ABNORMAL LOW (ref 8.9–10.3)
Chloride: 104 mmol/L (ref 98–111)
Creatinine, Ser: 1.17 mg/dL (ref 0.61–1.24)
GFR, Estimated: >60 mL/min (ref >60)
Glucose, Bld: 92 mg/dL (ref 70–99)
Potassium: 3.9 mmol/L (ref 3.5–5.1)
Sodium: 139 mmol/L (ref 135–145)

## 2021-09-17 LAB — CK: Total CK: 81 U/L (ref 49–397)

## 2021-09-17 LAB — D-DIMER, QUANTITATIVE: D-Dimer, Quant: 1.21 ug/mL-FEU — ABNORMAL HIGH (ref 0.00–0.50)

## 2021-09-17 MED ORDER — IOHEXOL 350 MG/ML SOLN
100.0000 mL | Freq: Once | INTRAVENOUS | Status: AC | PRN
  Administered 2021-09-17: 100 mL via INTRAVENOUS

## 2021-09-17 MED ORDER — ACETAMINOPHEN 325 MG PO TABS
650.0000 mg | ORAL_TABLET | Freq: Once | ORAL | Status: AC
  Administered 2021-09-17: 650 mg via ORAL
  Filled 2021-09-17: qty 2

## 2021-09-17 MED ORDER — KETOROLAC TROMETHAMINE 30 MG/ML IJ SOLN
30.0000 mg | Freq: Once | INTRAMUSCULAR | Status: AC
  Administered 2021-09-17: 30 mg via INTRAVENOUS
  Filled 2021-09-17: qty 1

## 2021-09-17 NOTE — Discharge Instructions (Signed)
Keep yourself quarantined for total of 5 days while you are having fevers.  Use Tylenol or Motrin as needed for aches and fever.  Return to the ED with difficulty breathing, chest pain, not able to tolerate fluids, persistent high fevers or any other concerns

## 2022-02-17 ENCOUNTER — Encounter: Payer: Medicaid - Out of State | Admitting: Internal Medicine

## 2022-02-24 ENCOUNTER — Encounter: Payer: Self-pay | Admitting: Internal Medicine

## 2022-02-24 ENCOUNTER — Ambulatory Visit (INDEPENDENT_AMBULATORY_CARE_PROVIDER_SITE_OTHER): Payer: PRIVATE HEALTH INSURANCE | Admitting: Internal Medicine

## 2022-02-24 VITALS — BP 119/81 | HR 65 | Ht 70.0 in | Wt 280.0 lb

## 2022-02-24 DIAGNOSIS — I872 Venous insufficiency (chronic) (peripheral): Secondary | ICD-10-CM

## 2022-02-24 DIAGNOSIS — I471 Supraventricular tachycardia: Secondary | ICD-10-CM | POA: Diagnosis not present

## 2022-02-24 DIAGNOSIS — I491 Atrial premature depolarization: Secondary | ICD-10-CM | POA: Diagnosis not present

## 2022-02-24 NOTE — Progress Notes (Signed)
   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  Since last being seen in our clinic, the patient reports doing very well.  Today, he denies symptoms of palpitations, chest pain, shortness of breath,   dizziness, presyncope, or syncope.  He has stable edema.  The patient is otherwise without complaint today.   Past Medical History:  Diagnosis Date   Asthma    Atrial flutter (HCC)    Status post RFA 10/10 - Dr. Graciela Husbands, repeat ablation for recurrent by Dr Johney Frame 9/12   Bifascicular block    chronic (since at least 2010)   Obstructive sleep apnea    Past Surgical History:  Procedure Laterality Date   ATRIAL ABLATION SURGERY     CTI ablation x 2   NASAL SINUS SURGERY     TONSILLECTOMY      ROS- all systems are reviewed and negatives except as per HPI above  Current Outpatient Medications  Medication Sig Dispense Refill   albuterol (PROVENTIL HFA;VENTOLIN HFA) 108 (90 BASE) MCG/ACT inhaler Inhale 2 puffs into the lungs every 6 (six) hours as needed for wheezing.     budesonide-formoterol (SYMBICORT) 160-4.5 MCG/ACT inhaler Inhale 2 puffs into the lungs 2 (two) times daily.     diltiazem (CARDIZEM) 30 MG tablet Take 1-2 tablets (30-60 mg total) by mouth 4 (four) times daily as needed. 30 tablet 3   No current facility-administered medications for this visit.    Physical Exam: Vitals:   02/24/22 0834  BP: 119/81  Pulse: 65  SpO2: 97%  Weight: 280 lb (127 kg)  Height: 5\' 10"  (1.778 m)    GEN- The patient is well appearing, alert and oriented x 3 today.   Head- normocephalic, atraumatic Eyes-  Sclera clear, conjunctiva pink Ears- hearing intact Oropharynx- clear Lungs- Clear to ausculation bilaterally, normal work of breathing Heart- Regular rate and rhythm, no murmurs, rubs or gallops, PMI not laterally displaced GI- soft, NT, ND, + BS Extremities- no clubbing, cyanosis, or  edema  Wt Readings from Last 3 Encounters:  02/24/22 280 lb (127 kg)  09/16/21 272 lb 11.3 oz (123.7 kg)  02/25/21 272 lb 9.6 oz (123.7 kg)    EKG tracing ordered today is personally reviewed and shows sinus rhythm, RBBB  Assessment and Plan:  Nonsustained atach/ PACs Well controlled Asymptomatic Prn diltiazem  2. Obesity Body mass index is 40.18 kg/m. Lifestyle modification advised  3. Venous disease I referred to [facility] previously.  He has been advised to wear support hose.  He says he saw them though I do not see a note in epic.  Return in a year to see cardiology APP  04/27/21 MD, [facility] 02/24/2022 8:43 AM

## 2022-02-24 NOTE — Patient Instructions (Signed)
Medication Instructions:  Your physician recommends that you continue on your current medications as directed. Please refer to the Current Medication list given to you today.  *If you need a refill on your cardiac medications before your next appointment, please call your pharmacy*   Lab Work: None ordered.  If you have labs (blood work) drawn today and your tests are completely normal, you will receive your results only by: MyChart Message (if you have MyChart) OR A paper copy in the mail If you have any lab test that is abnormal or we need to change your treatment, we will call you to review the results.   Testing/Procedures: None ordered.    Follow-Up: At [facility], you and your health needs are our priority.  As part of our continuing mission to provide you with exceptional heart care, we have created designated Provider Care Teams.  These Care Teams include your primary Cardiologist (physician) and Advanced Practice Providers (APPs -  Physician Assistants and Nurse Practitioners) who all work together to provide you with the care you need, when you need it.  We recommend signing up for the patient portal called "MyChart".  Sign up information is provided on this After Visit Summary.  MyChart is used to connect with patients for Virtual Visits (Telemedicine).  Patients are able to view lab/test results, encounter notes, upcoming appointments, etc.  Non-urgent messages can be sent to your provider as well.   To learn more about what you can do with MyChart, go to [email or web address].    Your next appointment:   12 month(s)  The format for your next appointment:   In Person  Provider:   You will see one of the following Advanced Practice Providers on your designated Care Team or Dr Trish Fountain    Important Information About Sugar      Medication Instructions:  Your physician recommends that you continue on your current medications as directed. Please refer to the  Current Medication list given to you today.  *If you need a refill on your cardiac medications before your next appointment, please call your pharmacy*   Lab Work: None ordered.  If you have labs (blood work) drawn today and your tests are completely normal, you will receive your results only by: MyChart Message (if you have MyChart) OR A paper copy in the mail If you have any lab test that is abnormal or we need to change your treatment, we will call you to review the results.   Testing/Procedures: None ordered.    Follow-Up: At [facility], you and your health needs are our priority.  As part of our continuing mission to provide you with exceptional heart care, we have created designated Provider Care Teams.  These Care Teams include your primary Cardiologist (physician) and Advanced Practice Providers (APPs -  Physician Assistants and Nurse Practitioners) who all work together to provide you with the care you need, when you need it.  We recommend signing up for the patient portal called "MyChart".  Sign up information is provided on this After Visit Summary.  MyChart is used to connect with patients for Virtual Visits (Telemedicine).  Patients are able to view lab/test results, encounter notes, upcoming appointments, etc.  Non-urgent messages can be sent to your provider as well.   To learn more about what you can do with MyChart, go to [email or web address].    Your next appointment:   12 months with Dr Johney Frame or PA  Important Information About Sugar

## 2022-09-01 ENCOUNTER — Encounter: Payer: Self-pay | Admitting: Cardiovascular Disease

## 2022-10-07 IMAGING — CT CT ANGIO CHEST
2 of 6 series · 18 of 46 positions shown · IV contrast (omnipaque)
Comparison: 11/07/03

CLINICAL DATA: Positive D-dimer.  Weakness and shortness of breath.

EXAM:
CT ANGIOGRAPHY CHEST WITH CONTRAST
TECHNIQUE: Multidetector CT imaging of the chest was performed using the
standard protocol during bolus administration of intravenous
contrast. Multiplanar CT image reconstructions and MIPs were
obtained to evaluate the vascular anatomy.
CONTRAST:  100mL OMNIPAQUE IOHEXOL 350 MG/ML SOLN

[Series 5: pe axial thins · axial · 0.84mm/px · z∈[+1086,+1347]mm · 15 of 356 slices shown]
[im 15/356  lung]
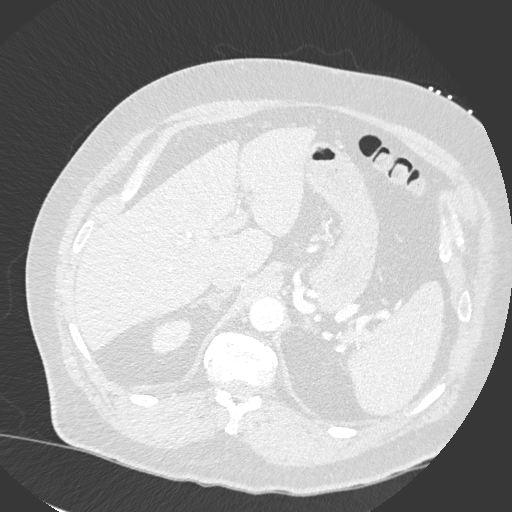
[im 45/356  soft-tissue]
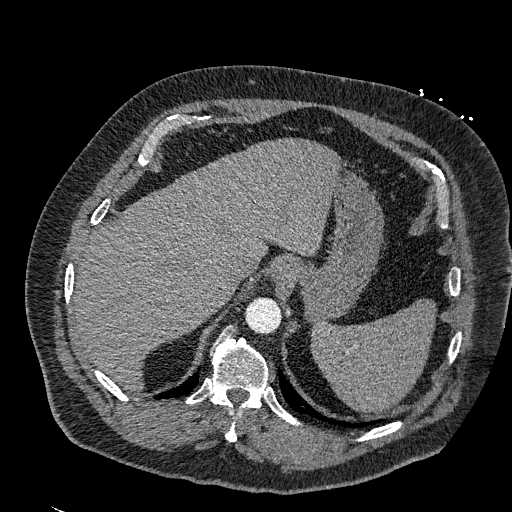
[im 60/356  lung]
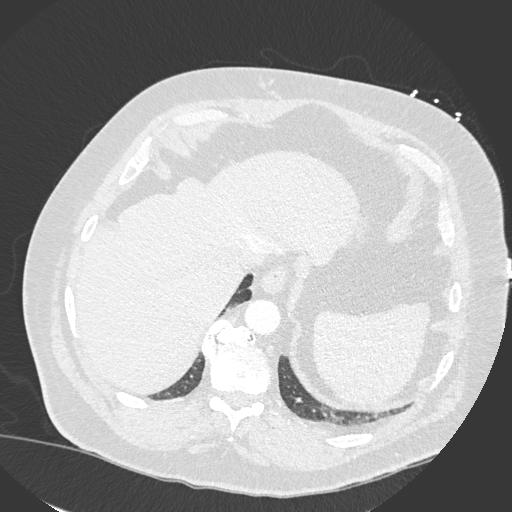
[im 89/356  soft-tissue]
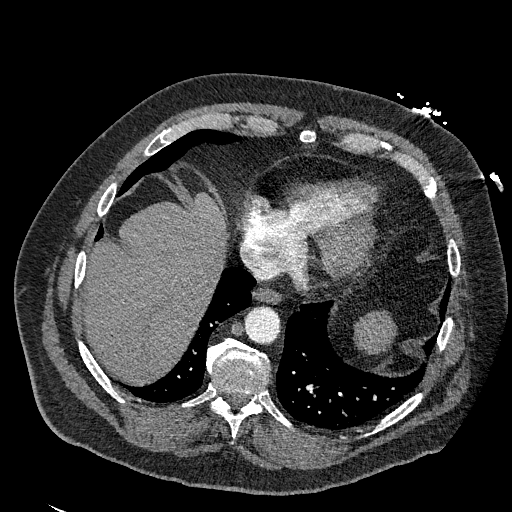
[im 104/356  lung]
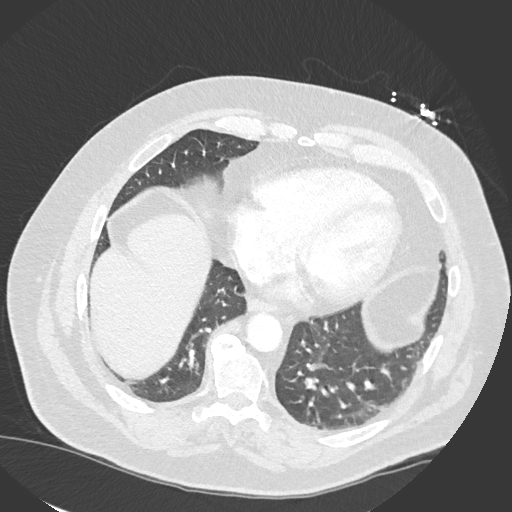
[im 134/356  soft-tissue]
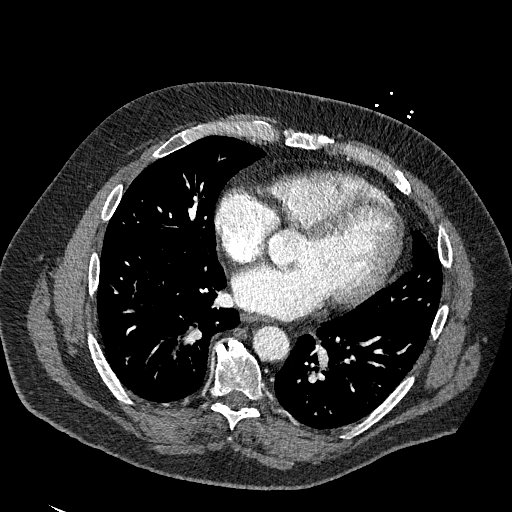
[im 148/356  lung]
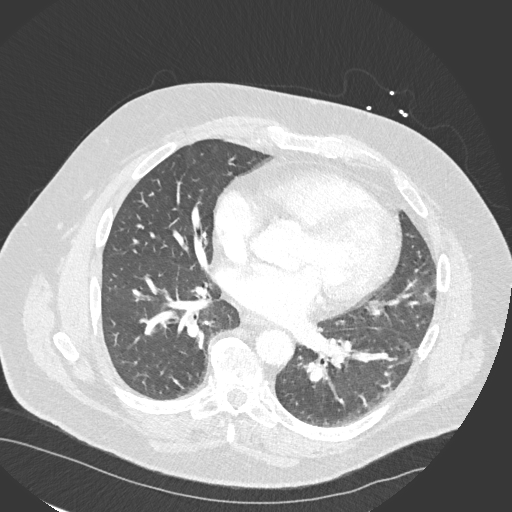
[im 178/356  soft-tissue]
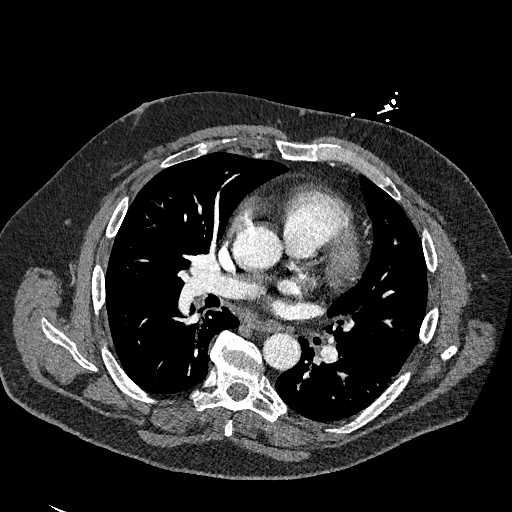
[im 208/356  lung]
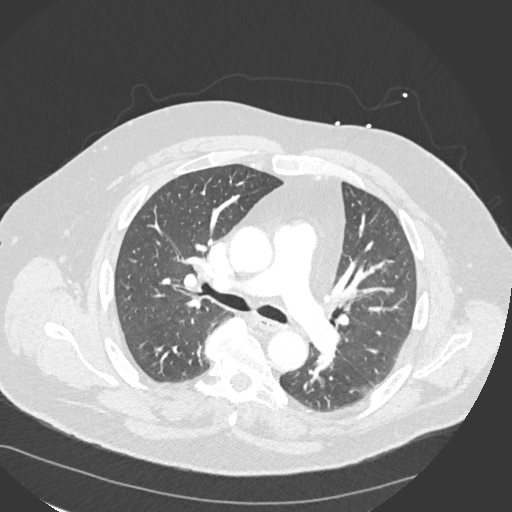
[im 222/356  soft-tissue]
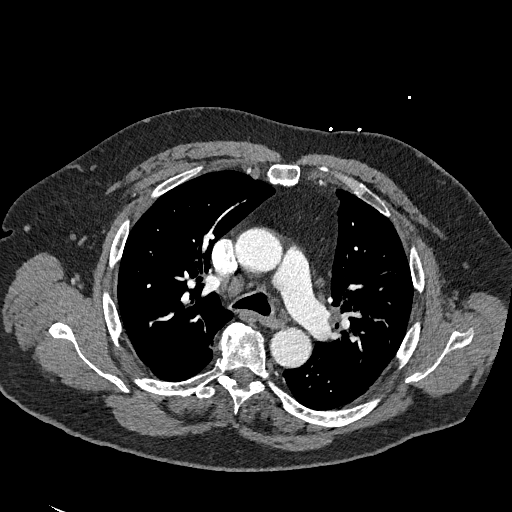
[im 252/356  lung]
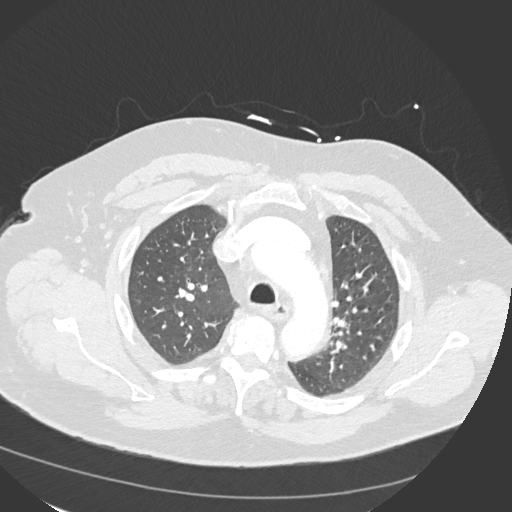
[im 267/356  soft-tissue]
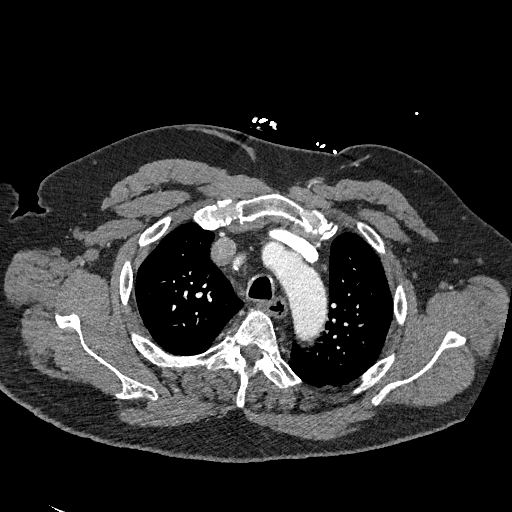
[im 296/356  lung]
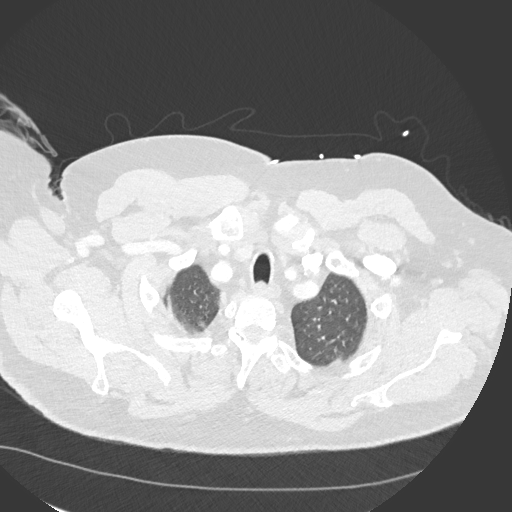
[im 311/356  soft-tissue]
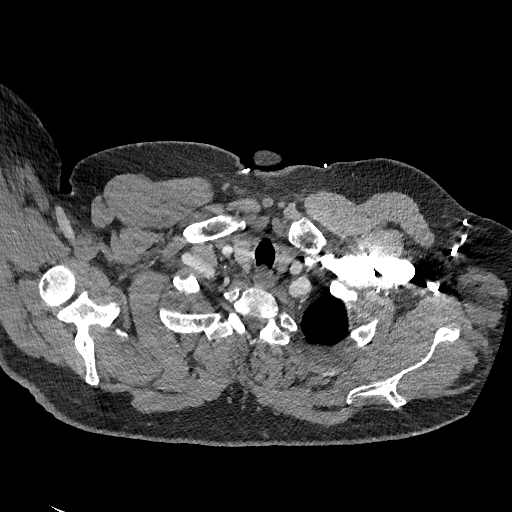
[im 341/356  lung]
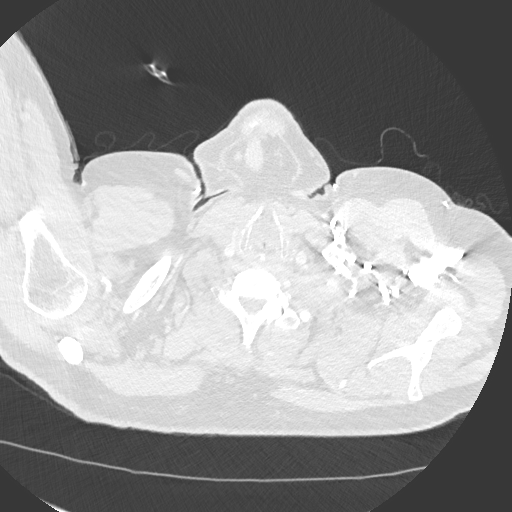

[Series 7: cor soft · coronal · 0.59mm/px · 3 of 186 slices shown]
[im 47/186  soft-tissue]
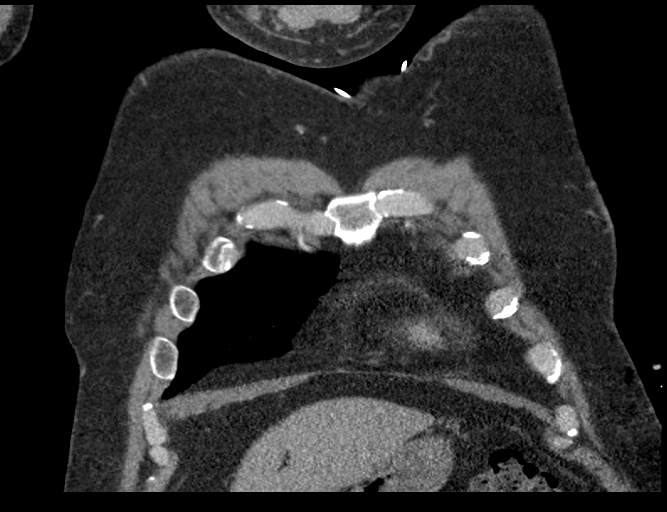
[im 93/186  soft-tissue]
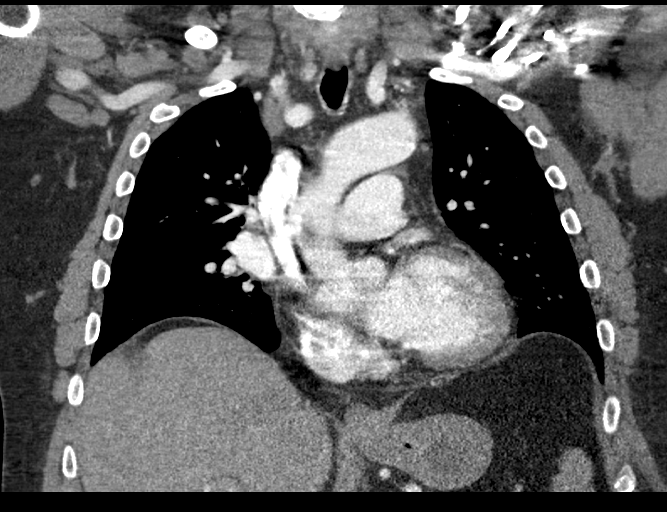
[im 139/186  soft-tissue]
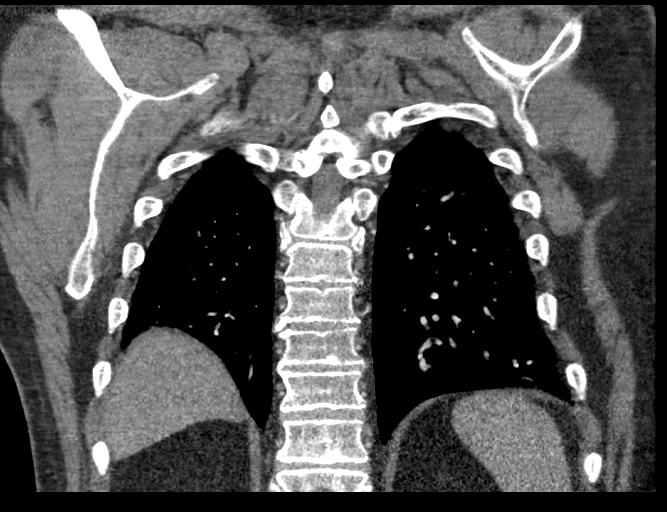

[18 of 46 positions shown; findings below may reference images not displayed]

FINDINGS: Cardiovascular: Satisfactory opacification of the pulmonary arteries
to the segmental level. No evidence of pulmonary embolism.

Aortic atherosclerosis. Mild coronary artery calcifications. No
pericardial effusion. Heart size appears normal.

Mediastinum/Nodes: No enlarged mediastinal, hilar, or axillary lymph
nodes. Thyroid gland, trachea, and esophagus demonstrate no
significant findings.

Lungs/Pleura: Dependent changes are identified within both lung
bases. No pleural effusion, interstitial edema, or airspace
consolidation. No pneumothorax identified. Calcified granuloma noted
within the left lung base. No suspicious nodule or mass.

Upper Abdomen: No acute abnormality.

Musculoskeletal: Thoracic spondylosis. No acute or suspicious
osseous findings.

Review of the MIP images confirms the above findings.
IMPRESSION: 1. No evidence for acute pulmonary embolus.
2. Aortic Atherosclerosis ([id number]). Coronary artery
calcifications.

## 2023-02-23 ENCOUNTER — Ambulatory Visit: Payer: Medicaid - Out of State | Admitting: Internal Medicine

## 2023-03-23 ENCOUNTER — Ambulatory Visit: Payer: Medicaid - Out of State | Attending: Internal Medicine | Admitting: Cardiovascular Disease

## 2023-03-23 ENCOUNTER — Encounter: Payer: Self-pay | Admitting: Cardiovascular Disease

## 2023-03-23 VITALS — BP 110/60 | HR 76 | Wt 276.6 lb

## 2023-03-23 DIAGNOSIS — I4719 Other supraventricular tachycardia: Secondary | ICD-10-CM

## 2023-03-23 MED ORDER — DILTIAZEM HCL 30 MG PO TABS: 30.0000 mg | ORAL_TABLET | Freq: Four times a day (QID) | ORAL | 3 refills | Status: AC | PRN

## 2023-03-23 NOTE — Progress Notes (Signed)
   PCP: Donetta Potts, MD   Primary EP: Dr Nelly Laurence  Elijah Beard is a 62 y.o. male who presents today for routine electrophysiology followup.  Since last being seen in our clinic, the patient reports doing very well.    He works in [employer], very active with his job.  Today, he denies symptoms of palpitations, chest pain, shortness of breath,   dizziness, presyncope, or syncope.  He has stable edema.  The patient is otherwise without complaint today.   Past Medical History:  Diagnosis Date   Asthma    Atrial flutter (HCC)    Status post RFA 10/10 - Dr. Graciela Husbands, repeat ablation for recurrent by Dr Johney Frame 9/12   Bifascicular block    chronic (since at least 2010)   Obstructive sleep apnea    Past Surgical History:  Procedure Laterality Date   ATRIAL ABLATION SURGERY     CTI ablation x 2   NASAL SINUS SURGERY     TONSILLECTOMY      ROS- all systems are reviewed and negatives except as per HPI above  Current Outpatient Medications  Medication Sig Dispense Refill   albuterol (PROVENTIL HFA;VENTOLIN HFA) 108 (90 BASE) MCG/ACT inhaler Inhale 2 puffs into the lungs every 6 (six) hours as needed for wheezing.     budesonide-formoterol (SYMBICORT) 160-4.5 MCG/ACT inhaler Inhale 2 puffs into the lungs 2 (two) times daily.     diltiazem (CARDIZEM) 30 MG tablet Take 1-2 tablets (30-60 mg total) by mouth 4 (four) times daily as needed. 30 tablet 3   No current facility-administered medications for this visit.    Physical Exam: Vitals:   03/23/23 0859  BP: 110/60  Pulse: 76  SpO2: 97%  Weight: 276 lb 9.6 oz (125.5 kg)     Gen: Appears comfortable, well-nourished CV: RRR, trace LLE dependent edema Pulm: breathing easily  Wt Readings from Last 3 Encounters:  03/23/23 276 lb 9.6 oz (125.5 kg)  02/24/22 280 lb (127 kg)  09/16/21 272 lb 11.3 oz (123.7 kg)    EKG tracing ordered today is personally reviewed and shows sinus rhythm, RBBB  Assessment and Plan:  Nonsustained  atach/ PACs Well controlled Asymptomatic Prn diltiazem - refilled today.  2. Obesity Body mass index is 39.69 kg/m. Lifestyle modification advised  3. Venous disease Planning to have surgery soon.   Return in a year to see cardiology APP  Maurice Small, MD 03/23/2023 9:35 AM

## 2023-03-23 NOTE — Patient Instructions (Addendum)

## 2023-04-17 ENCOUNTER — Emergency Department (HOSPITAL_COMMUNITY)
Admission: EM | Admit: 2023-04-17 | Discharge: 2023-04-17 | Disposition: A | Discharge status: Home / Self Care | Payer: Medicaid Other | Attending: Emergency Medicine | Admitting: Emergency Medicine

## 2023-04-17 ENCOUNTER — Encounter (HOSPITAL_COMMUNITY): Payer: Self-pay

## 2023-04-17 ENCOUNTER — Other Ambulatory Visit: Payer: Self-pay

## 2023-04-17 DIAGNOSIS — I491 Atrial premature depolarization: Secondary | ICD-10-CM | POA: Diagnosis not present

## 2023-04-17 DIAGNOSIS — R009 Unspecified abnormalities of heart beat: Secondary | ICD-10-CM | POA: Diagnosis present

## 2023-04-17 LAB — CBC
HCT: 46.4 % (ref 39.0–52.0)
Hemoglobin: 15.4 g/dL (ref 13.0–17.0)
MCH: 31.5 pg (ref 26.0–34.0)
MCHC: 33.2 g/dL (ref 30.0–36.0)
MCV: 94.9 fL (ref 80.0–100.0)
Platelets: 149 10*3/uL — ABNORMAL LOW (ref 150–400)
RBC: 4.89 MIL/uL (ref 4.22–5.81)
RDW: 13.3 % (ref 11.5–15.5)
WBC: 6.8 10*3/uL (ref 4.0–10.5)
nRBC: 0 % (ref 0.0–0.2)

## 2023-04-17 LAB — BASIC METABOLIC PANEL
Anion gap: 5 (ref 5–15)
BUN: 19 mg/dL (ref 6–20)
CO2: 25 mmol/L (ref 22–32)
Calcium: 9 mg/dL (ref 8.9–10.3)
Chloride: 110 mmol/L (ref 98–111)
Creatinine, Ser: 1.06 mg/dL (ref 0.61–1.24)
GFR, Estimated: >60 mL/min (ref >60)
Glucose, Bld: 120 mg/dL — ABNORMAL HIGH (ref 70–99)
Potassium: 4.2 mmol/L (ref 3.5–5.1)
Sodium: 140 mmol/L (ref 135–145)

## 2023-04-17 LAB — TROPONIN I (HIGH SENSITIVITY): Troponin I (High Sensitivity): 5 ng/L (ref <18)

## 2023-04-17 NOTE — ED Triage Notes (Signed)
Pt c/o feeling like his heart is going in and out of afib. Pt has had this diagnosis for a while but started acting up x1 year ago. Pt on diltiazem 30mg  in morning and night and Eliquis 5mg  morning and night. Pt states that he just feels weak and tired. HR changing from 39-77 on monitor.

## 2023-04-17 NOTE — ED Provider Notes (Signed)
EMERGENCY DEPARTMENT AT [facility] Provider Note   CSN: [id number] Arrival date & time: 04/17/23  2148     History  Chief Complaint  Patient presents with   Irregular Heart Beat    Elijah Beard is a 62 y.o. male.  Presents to the emergency department with concerns over irregular heartbeat.  Patient reports that he was admitted to [facility] 1 week ago with atrial fibrillation.  He thinks he is back in A-fib.  No chest pain, shortness of breath.       Home Medications Prior to Admission medications   Medication Sig Start Date End Date Taking? Authorizing Provider  albuterol (PROVENTIL HFA;VENTOLIN HFA) 108 (90 BASE) MCG/ACT inhaler Inhale 2 puffs into the lungs every 6 (six) hours as needed for wheezing.    [provider]  budesonide-formoterol (SYMBICORT) 160-4.5 MCG/ACT inhaler Inhale 2 puffs into the lungs 2 (two) times daily.    [provider]  diltiazem (CARDIZEM) 30 MG tablet Take 1-2 tablets (30-60 mg total) by mouth 4 (four) times daily as needed. 03/23/23   Mealor, Roberts Gaudy, MD      Allergies    Patient has no known allergies.    Review of Systems   Review of Systems  Physical Exam Updated Vital Signs BP 123/73   Pulse 70   Temp 98.6 F (37 C) (Oral)   Resp 20   SpO2 96%  Physical Exam Vitals and nursing note reviewed.  Constitutional:      General: He is not in acute distress.    Appearance: He is well-developed.  HENT:     Head: Normocephalic and atraumatic.     Mouth/Throat:     Mouth: Mucous membranes are moist.  Eyes:     General: Vision grossly intact. Gaze aligned appropriately.     Extraocular Movements: Extraocular movements intact.     Conjunctiva/sclera: Conjunctivae normal.  Cardiovascular:     Rate and Rhythm: Normal rate. Rhythm irregular.     Pulses: Normal pulses.     Heart sounds: Normal heart sounds, S1 normal and S2 normal. No murmur heard.    No friction rub. No gallop.   Pulmonary:     Effort: Pulmonary effort is normal. No respiratory distress.     Breath sounds: Normal breath sounds.  Abdominal:     Palpations: Abdomen is soft.     Tenderness: There is no abdominal tenderness. There is no guarding or rebound.     Hernia: No hernia is present.  Musculoskeletal:        General: No swelling.     Cervical back: Full passive range of motion without pain, normal range of motion and neck supple. No pain with movement, spinous process tenderness or muscular tenderness. Normal range of motion.     Right lower leg: No edema.     Left lower leg: No edema.  Skin:    General: Skin is warm and dry.     Capillary Refill: Capillary refill takes less than 2 seconds.     Findings: No ecchymosis, erythema, lesion or wound.  Neurological:     Mental Status: He is alert and oriented to person, place, and time.     GCS: GCS eye subscore is 4. GCS verbal subscore is 5. GCS motor subscore is 6.     Cranial Nerves: Cranial nerves 2-12 are intact.     Sensory: Sensation is intact.     Motor: Motor function is intact. No weakness or  abnormal muscle tone.     Coordination: Coordination is intact.  Psychiatric:        Mood and Affect: Mood normal.        Speech: Speech normal.        Behavior: Behavior normal.     ED Results / Procedures / Treatments   Labs (all labs ordered are listed, but only abnormal results are displayed) Labs Reviewed  BASIC METABOLIC PANEL - Abnormal; Notable for the following components:      Result Value   Glucose, Bld 120 (*)    All other components within normal limits  CBC - Abnormal; Notable for the following components:   Platelets 149 (*)    All other components within normal limits  TROPONIN I (HIGH SENSITIVITY)    EKG EKG Interpretation Date/Time:  Tuesday April 17 2023 22:12:51 EDT Ventricular Rate:  74 PR Interval:  160 QRS Duration:  156 QT Interval:  430 QTC Calculation: 477 R Axis:   -74  Text Interpretation: Sinus  rhythm with Premature supraventricular complexes Right bundle branch block Left anterior fascicular block ** Bifascicular block ** Possible Lateral infarct (cited on or before 23-Mar-2023) Abnormal ECG When compared with ECG of 23-Mar-2023 09:07, No significant change was found Confirmed by Gilda Crease [phone]) on 04/17/2023 11:22:48 PM  Radiology No results found.  Procedures Procedures    Medications Ordered in ED Medications - No data to display  ED Course/ Medical Decision Making/ A&P         CHA2DS2-VASc Score: 0                        Medical Decision Making Amount and/or Complexity of Data Reviewed Labs: ordered.   Patient presents with complaints of irregular heartbeat.  Patient concerned that he is in atrial fibrillation.  Patient does have a history of A-fib, was recently hospitalized at another hospital and spontaneously converted.  He is not anticoagulated (CHA2DS2-VASc Score: 0).    Patient placed on a monitor, is having frequent ectopy but is in sinus rhythm.  Basic workup is unremarkable.  Patient reassured, he is not in A-fib and does not require any further monitoring at this time.  He will follow-up with cardiology as an outpatient.        Final Clinical Impression(s) / ED Diagnoses Final diagnoses:  Premature atrial contractions    Rx / DC Orders ED Discharge Orders     None         Anila Bojarski, Canary Brim, MD 04/17/23 2327

## 2023-04-20 ENCOUNTER — Encounter: Payer: Self-pay | Admitting: Cardiovascular Disease

## 2023-04-20 ENCOUNTER — Ambulatory Visit: Payer: Medicaid - Out of State | Attending: Cardiovascular Disease | Admitting: Cardiovascular Disease

## 2023-04-20 VITALS — BP 108/72 | HR 62 | Ht 70.0 in | Wt 276.6 lb

## 2023-04-20 DIAGNOSIS — I4719 Other supraventricular tachycardia: Secondary | ICD-10-CM | POA: Diagnosis not present

## 2023-04-20 NOTE — Patient Instructions (Addendum)
Medication Instructions:   Continue all current medications.   Labwork:  none  Testing/Procedures:  Your physician has requested that you have an echocardiogram. Echocardiography is a painless test that uses sound waves to create images of your heart. It provides your doctor with information about the size and shape of your heart and how well your heart's chambers and valves are working. This procedure takes approximately one hour. There are no restrictions for this procedure. Please do NOT wear cologne, perfume, aftershave, or lotions (deodorant is allowed). Please arrive 15 minutes prior to your appointment time. Your physician has recommended that you wear a 14 day event monitor. Event monitors are medical devices that record the heart's electrical activity. Doctors most often [place] these monitors to diagnose arrhythmias. Arrhythmias are problems with the speed or rhythm of the heartbeat. The monitor is a small, portable device. You can wear one while you do your normal daily activities. This is usually used to diagnose what is causing palpitations/syncope (passing out). Your physician has recommended that you have a sleep study. This test records several body functions during sleep, including: brain activity, eye movement, oxygen and carbon dioxide blood levels, heart rate and rhythm, breathing rate and rhythm, the flow of air through your mouth and nose, snoring, body muscle movements, and chest and belly movement.  Office will contact with results via phone, letter or mychart.     Follow-Up:  6 wks - 2 months   Any Other Special Instructions Will Be Listed Below (If Applicable).   If you need a refill on your cardiac medications before your next appointment, please call your pharmacy.

## 2023-04-20 NOTE — Addendum Note (Signed)
Addended by: Lesle Chris on: 04/20/2023 02:19 PM   Modules accepted: Orders

## 2023-04-20 NOTE — Progress Notes (Signed)
   PCP: Donetta Potts, MD   Primary EP: Dr Nelly Laurence  Elijah Beard is a 62 y.o. male who presents today for routine electrophysiology followup.  Since last being seen in our clinic, the patient reports doing very well.        He works in [employer], very active with his job.  He was seen at [facility] on July 21 with rapid palpitations.  He reports that he had been having these rapid heart rates for about a week prior to presentation.  EKG reportedly showed atrial fibrillation, but strips were not available for review.  He presented to [facility] on July 30 with complaints of irregular heartbeat.  He reports that he presented initially due to concern for low heart rates.  There, the EKG showed PACs.  He was started on Eliquis       Today, he denies symptoms of palpitations, chest pain, shortness of breath,   dizziness, presyncope, or syncope.  He has stable edema.  The patient is otherwise without complaint today.       Physical Exam: Vitals:   04/20/23 1308  BP: 108/72  Pulse: 62  SpO2: 97%  Weight: 276 lb 9.6 oz (125.5 kg)  Height: 5\' 10"  (1.778 m)     Gen: Appears comfortable, well-nourished CV: RRR, trace LLE dependent edema Pulm: breathing easily  Wt Readings from Last 3 Encounters:  04/20/23 276 lb 9.6 oz (125.5 kg)  03/23/23 276 lb 9.6 oz (125.5 kg)  02/24/22 280 lb (127 kg)    EKG tracing ordered today is personally reviewed and shows sinus rhythm, RBBB  Assessment and Plan:  Nonsustained atach/ PACs Now, reports of atrial fibrillation during ER visit to [facility] ECG from the visit is not available --will request records Will order TTE, 14-day ZIO Possible sleep apnea will order sleep study Continue Eliquis 5 mg for now Continue diltiazem.   2. Obesity Body mass index is 39.69 kg/m. Lifestyle modification advised  3. Venous disease Planning to have surgery soon.     Maurice Small, MD 04/20/2023 1:18 PM

## 2023-04-24 ENCOUNTER — Other Ambulatory Visit: Payer: Self-pay | Admitting: Cardiovascular Disease

## 2023-04-24 ENCOUNTER — Telehealth: Payer: Self-pay | Admitting: *Deleted

## 2023-04-24 ENCOUNTER — Ambulatory Visit: Payer: PRIVATE HEALTH INSURANCE | Attending: Cardiovascular Disease

## 2023-04-24 ENCOUNTER — Encounter: Payer: Self-pay | Admitting: *Deleted

## 2023-04-24 ENCOUNTER — Telehealth: Payer: Self-pay | Admitting: Cardiovascular Disease

## 2023-04-24 DIAGNOSIS — I4891 Unspecified atrial fibrillation: Secondary | ICD-10-CM

## 2023-04-24 NOTE — Telephone Encounter (Signed)
Sleep Apnea Evaluation  [facility]  Today's Date: 04/24/2023   Patient Name: Elijah Beard        DOB: 1961-07-24       Height:        Weight:    BMI: There is no height or weight on file to calculate BMI.    Referring Provider:  Mealor    STOP-BANG RISK ASSESSMENT         If STOP-BANG Score ?3 OR two clinical symptoms - patient qualifies for WatchPAT (CPT 95800)      Sleep study ordered due to two (2) of the following clinical symptoms/diagnoses:  Excessive daytime sleepiness G47.10  Gastroesophageal reflux K21.9  Nocturia R35.1  Morning Headaches G44.221  Difficulty concentrating R41.840  Memory problems or poor judgment G31.84  Personality changes or irritability R45.4  Loud snoring R06.83  Depression F32.9  Unrefreshed by sleep G47.8  Impotence N52.9  History of high blood pressure R03.0  Insomnia G47.00  Sleep Disordered Breathing or Sleep Apnea ICD G47.33

## 2023-04-24 NOTE — Telephone Encounter (Signed)
Checking percert on the following   LONG TERM MONITOR (3-14 DAYS

## 2023-04-25 ENCOUNTER — Telehealth: Payer: Self-pay | Admitting: Cardiovascular Disease

## 2023-04-25 NOTE — Telephone Encounter (Signed)
Just got off the phone with the carrier and the echo for Elijah Beard MRN# [id number] was denied, due to the patient not having out of network benefits. He can call member services at [phone] speak to a representative and they will advise what facilities are covered under his policy.

## 2023-04-25 NOTE — Telephone Encounter (Signed)
Patient returned staff call. 

## 2023-04-25 NOTE — Telephone Encounter (Signed)
Noted  

## 2023-04-26 ENCOUNTER — Other Ambulatory Visit: Payer: PRIVATE HEALTH INSURANCE

## 2023-04-27 ENCOUNTER — Ambulatory Visit: Payer: PRIVATE HEALTH INSURANCE | Admitting: Cardiovascular Disease

## 2023-05-25 ENCOUNTER — Ambulatory Visit: Payer: PRIVATE HEALTH INSURANCE | Admitting: Cardiovascular Disease
